# Patient Record
Sex: Female | Born: 1965 | Race: White | Hispanic: No | Marital: Married | Smoking: Never smoker
Health system: Southern US, Community
[De-identification: ages and names within clinical notes are randomized; demographics above are authoritative.]

## PROBLEM LIST (undated history)

## (undated) DIAGNOSIS — N83209 Unspecified ovarian cyst, unspecified side: Secondary | ICD-10-CM

## (undated) DIAGNOSIS — D649 Anemia, unspecified: Secondary | ICD-10-CM

## (undated) DIAGNOSIS — K625 Hemorrhage of anus and rectum: Secondary | ICD-10-CM

## (undated) DIAGNOSIS — J189 Pneumonia, unspecified organism: Secondary | ICD-10-CM

## (undated) DIAGNOSIS — K219 Gastro-esophageal reflux disease without esophagitis: Secondary | ICD-10-CM

## (undated) DIAGNOSIS — K469 Unspecified abdominal hernia without obstruction or gangrene: Secondary | ICD-10-CM

## (undated) DIAGNOSIS — R42 Dizziness and giddiness: Secondary | ICD-10-CM

## (undated) DIAGNOSIS — R7301 Impaired fasting glucose: Secondary | ICD-10-CM

## (undated) DIAGNOSIS — E8881 Metabolic syndrome: Secondary | ICD-10-CM

## (undated) DIAGNOSIS — E079 Disorder of thyroid, unspecified: Secondary | ICD-10-CM

## (undated) HISTORY — PX: ABDOMINAL HYSTERECTOMY: SHX81

## (undated) HISTORY — DX: Gastro-esophageal reflux disease without esophagitis: K21.9

## (undated) HISTORY — PX: TUBAL LIGATION: SHX77

## (undated) HISTORY — DX: Disorder of thyroid, unspecified: E07.9

## (undated) HISTORY — DX: Pneumonia, unspecified organism: J18.9

## (undated) HISTORY — DX: Anemia, unspecified: D64.9

## (undated) HISTORY — DX: Impaired fasting glucose: R73.01

## (undated) HISTORY — DX: Hemorrhage of anus and rectum: K62.5

## (undated) HISTORY — DX: Metabolic syndrome: E88.81

## (undated) HISTORY — DX: Unspecified ovarian cyst, unspecified side: N83.209

## (undated) HISTORY — DX: Dizziness and giddiness: R42

## (undated) HISTORY — DX: Unspecified abdominal hernia without obstruction or gangrene: K46.9

---
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
## 2001-09-18 ENCOUNTER — Encounter: Payer: Self-pay | Admitting: Family Medicine

## 2001-09-18 ENCOUNTER — Ambulatory Visit (HOSPITAL_COMMUNITY): Admission: RE | Admit: 2001-09-18 | Discharge: 2001-09-18 | Payer: Self-pay | Admitting: Family Medicine

## 2004-05-03 DIAGNOSIS — E8881 Metabolic syndrome: Secondary | ICD-10-CM

## 2004-05-03 HISTORY — DX: Metabolic syndrome: E88.81

## 2004-05-03 HISTORY — DX: Metabolic syndrome: E88.810

## 2005-10-17 ENCOUNTER — Emergency Department (HOSPITAL_COMMUNITY): Admission: EM | Admit: 2005-10-17 | Discharge: 2005-10-17 | Payer: Self-pay | Admitting: Emergency Medicine

## 2006-07-22 ENCOUNTER — Emergency Department (HOSPITAL_COMMUNITY): Admission: EM | Admit: 2006-07-22 | Discharge: 2006-07-22 | Payer: Self-pay | Admitting: Emergency Medicine

## 2006-09-21 ENCOUNTER — Ambulatory Visit (HOSPITAL_COMMUNITY): Admission: RE | Admit: 2006-09-21 | Discharge: 2006-09-21 | Payer: Self-pay | Admitting: Family Medicine

## 2007-03-27 ENCOUNTER — Ambulatory Visit (HOSPITAL_COMMUNITY): Admission: RE | Admit: 2007-03-27 | Discharge: 2007-03-27 | Payer: Self-pay | Admitting: Preventative Medicine

## 2007-04-12 ENCOUNTER — Ambulatory Visit (HOSPITAL_COMMUNITY): Admission: RE | Admit: 2007-04-12 | Discharge: 2007-04-12 | Payer: Self-pay | Admitting: Family Medicine

## 2008-05-02 ENCOUNTER — Ambulatory Visit (HOSPITAL_COMMUNITY): Admission: RE | Admit: 2008-05-02 | Discharge: 2008-05-02 | Payer: Self-pay | Admitting: Family Medicine

## 2008-10-07 ENCOUNTER — Encounter: Admission: RE | Admit: 2008-10-07 | Discharge: 2008-10-07 | Payer: Self-pay | Admitting: General Surgery

## 2008-10-08 LAB — HM MAMMOGRAPHY

## 2010-05-24 ENCOUNTER — Encounter: Payer: Self-pay | Admitting: Family Medicine

## 2010-07-07 ENCOUNTER — Emergency Department (HOSPITAL_COMMUNITY)
Admission: EM | Admit: 2010-07-07 | Discharge: 2010-07-07 | Disposition: A | Discharge status: Home / Self Care | Payer: 59 | Attending: Emergency Medicine | Admitting: Emergency Medicine

## 2010-07-07 ENCOUNTER — Emergency Department (HOSPITAL_COMMUNITY): Payer: 59

## 2010-07-07 DIAGNOSIS — K625 Hemorrhage of anus and rectum: Secondary | ICD-10-CM | POA: Insufficient documentation

## 2010-07-07 LAB — DIFFERENTIAL
Basophils Relative: 0 % (ref 0–1)
Eosinophils Absolute: 0.3 10*3/uL (ref 0.0–0.7)
Lymphs Abs: 2.9 10*3/uL (ref 0.7–4.0)
Neutro Abs: 6.8 10*3/uL (ref 1.7–7.7)
Neutrophils Relative %: 62 % (ref 43–77)

## 2010-07-07 LAB — CBC
MCV: 87.8 fL (ref 78.0–100.0)
Platelets: 266 10*3/uL (ref 150–400)
RBC: 4.43 MIL/uL (ref 3.87–5.11)
WBC: 11 10*3/uL — ABNORMAL HIGH (ref 4.0–10.5)

## 2010-07-07 LAB — COMPREHENSIVE METABOLIC PANEL
ALT: 14 U/L (ref 0–35)
AST: 21 U/L (ref 0–37)
Albumin: 4.1 g/dL (ref 3.5–5.2)
Alkaline Phosphatase: 67 U/L (ref 39–117)
Chloride: 102 mEq/L (ref 96–112)
GFR calc Af Amer: >60 mL/min (ref >60)
Potassium: 4 mEq/L (ref 3.5–5.1)
Total Bilirubin: 0.7 mg/dL (ref 0.3–1.2)

## 2010-07-07 LAB — URINALYSIS, ROUTINE W REFLEX MICROSCOPIC
Bilirubin Urine: NEGATIVE
Hgb urine dipstick: NEGATIVE
Protein, ur: NEGATIVE mg/dL
Urobilinogen, UA: 0.2 mg/dL (ref 0.0–1.0)

## 2010-07-07 MED ORDER — IOHEXOL 300 MG/ML  SOLN
100.0000 mL | Freq: Once | INTRAMUSCULAR | Status: AC | PRN
  Administered 2010-07-07: 100 mL via INTRAVENOUS

## 2010-11-20 ENCOUNTER — Encounter (INDEPENDENT_AMBULATORY_CARE_PROVIDER_SITE_OTHER): Payer: Self-pay | Admitting: General Surgery

## 2010-11-20 ENCOUNTER — Ambulatory Visit (INDEPENDENT_AMBULATORY_CARE_PROVIDER_SITE_OTHER): Payer: 59 | Admitting: General Surgery

## 2010-11-20 VITALS — BP 154/102 | HR 76 | Temp 98.2°F | Ht 65.0 in | Wt 282.8 lb

## 2010-11-20 DIAGNOSIS — K429 Umbilical hernia without obstruction or gangrene: Secondary | ICD-10-CM | POA: Insufficient documentation

## 2010-11-20 NOTE — Progress Notes (Signed)
Terri Farley is a 45 y.o. female.    Chief Complaint  Patient presents with  . Other    new pt- hernia    HPI HPI  This patient is here for evaluation of a new umbilical hernia discovered on a recent CT scan of the abdomen which was obtained to evaluate lower abdominal pain. She states that in March she fell on her back and had some bright red blood around the stool and and in the toilet bowl which she describes as "jellylike". She also had some lower abdominal pain and was seen by her physician for evaluation. At that time she had a CT scan of the abdomen which demonstrated a small fat-containing umbilical hernia as well as a small hernia and a left ovarian cyst. She states that she has occasional discomfort at her umbilicus but denies any new changes in her bowels rupture symptoms. She can reduce the bulge with manual reduction which occasionally provides relief of her discomfort. She denies any nausea or vomiting. She has a history of 2 prior colonoscopies in the 1990s for a history of cramping and she states that she was supposed to have a followup scope but hasn't done this yet. Although she states that she has a referral from her primary physician for repeat colonoscopy. She has been concerned about her umbilical hernia since her uncle had an umbilical hernia with what she describes as incarcerated bowel.  Past Medical History  Diagnosis Date  . Thyroid disease     hypothyroid  . Anemia   . Ovarian cyst   . GERD (gastroesophageal reflux disease)   . Hernia   . Hemorrhoids   . Rectal bleeding   . Pneumonia   . Dizzy     Past Surgical History  Procedure Date  . Abdominal hysterectomy   . Foot surgery   . Tubal ligation     Family History  Problem Relation Age of Onset  . Cancer Mother     lymph  . Heart disease Brother     Social History History  Substance Use Topics  . Smoking status: Never Smoker   . Smokeless tobacco: Not on file  . Alcohol Use: No     Allergies  Allergen Reactions  . Vicodin (Hydrocodone-Acetaminophen)     Current Outpatient Prescriptions  Medication Sig Dispense Refill  . MAGNESIUM PO Take 2 tablets by mouth daily.        . Multiple Vitamin (MULTIVITAMIN PO) Take 1 tablet by mouth daily.        Marland Kitchen POTASSIUM CHLORIDE PO Take 99 mg by mouth daily.          Review of Systems Review of Systems  Constitutional: Negative.   HENT: Negative.   Eyes: Negative.   Respiratory: Negative.   Cardiovascular: Positive for leg swelling.  Gastrointestinal: Positive for abdominal pain and blood in stool.  Genitourinary: Negative.   Musculoskeletal: Negative.   Skin: Negative.   Neurological: Negative.   Endo/Heme/Allergies: Negative.   Psychiatric/Behavioral: Negative.   All other systems reviewed and are negative.    Physical Exam Physical Exam  Constitutional: She is oriented to person, place, and time. She appears well-developed and well-nourished. No distress.  HENT:  Head: Normocephalic and atraumatic.  Mouth/Throat: No oropharyngeal exudate.  Eyes: EOM are normal. Pupils are equal, round, and reactive to light. Right eye exhibits no discharge. Left eye exhibits no discharge. No scleral icterus.  Neck: Normal range of motion. Neck supple. No tracheal deviation present.  Cardiovascular: Normal rate, regular rhythm and normal heart sounds.   Respiratory: Effort normal and breath sounds normal. No stridor. No respiratory distress. She has no wheezes. She has no rales.  GI: Soft. Bowel sounds are normal. She exhibits no distension and no mass. There is no tenderness. There is no rebound and no guarding.       Small reducible umbilical hernia, fascial defect of approx 1.5-2cm noted  Musculoskeletal: Normal range of motion. She exhibits edema. She exhibits no tenderness.  Lymphadenopathy:    She has no cervical adenopathy.  Neurological: She is alert and oriented to person, place, and time. She has normal reflexes.   Skin: Skin is warm and dry. No rash noted. She is not diaphoretic. No erythema. No pallor.  Psychiatric: She has a normal mood and affect. Her behavior is normal. Judgment and thought content normal.     Blood pressure 154/102, pulse 76, temperature 98.2 F (36.8 C), height 5\' 5"  (1.651 m), weight 282 lb 12.8 oz (128.277 kg).  Assessment/Plan Umbilical hernia, reducible  She does have a small, reducible, umbilical hernia both on exam and on CT scan. Although this is a small hernia and I doubt it is responsible for any of her abdominal complaints, she is very concerned about the potential for incarceration ensuring relation since it sounds like her uncle had an episode of this. I discussed with her the options for open repair versus laparoscopic repair with or without mesh. She is interested in open repair because she is concerned that the laparoscopic port sites may cause further weakness and potential hernia. I explained that we may have to make a larger incision for open repair given her body habitus that she was okay with this. I also discussed the risks of infection, bleeding, pain, scarring, recurrence, need for mesh with its potential complications of foreign body sensation or mesh infection, and bowel injury. She states understanding and desires to proceed with open umbilical hernia repair.   Lodema Pilot DAVID 11/20/2010, 12:27 PM

## 2010-11-26 ENCOUNTER — Telehealth (INDEPENDENT_AMBULATORY_CARE_PROVIDER_SITE_OTHER): Payer: Self-pay

## 2010-11-26 ENCOUNTER — Other Ambulatory Visit (INDEPENDENT_AMBULATORY_CARE_PROVIDER_SITE_OTHER): Payer: Self-pay | Admitting: General Surgery

## 2010-11-26 ENCOUNTER — Encounter (HOSPITAL_COMMUNITY): Payer: 59

## 2010-11-26 LAB — CBC
HCT: 39.2 % (ref 36.0–46.0)
MCHC: 33.2 g/dL (ref 30.0–36.0)
MCV: 87.1 fL (ref 78.0–100.0)
RDW: 13.8 % (ref 11.5–15.5)
WBC: 8.5 10*3/uL (ref 4.0–10.5)

## 2010-11-26 NOTE — Telephone Encounter (Signed)
Terri Farley is requesting for the pt to get thigh high TED hose b/c the pt is requesting them for her surgery on 12-08-10 umbilical hernia. Pls notify Darlene at [facility]

## 2010-12-01 ENCOUNTER — Telehealth (INDEPENDENT_AMBULATORY_CARE_PROVIDER_SITE_OTHER): Payer: Self-pay | Admitting: General Surgery

## 2010-12-02 HISTORY — PX: HERNIA REPAIR: SHX51

## 2010-12-07 ENCOUNTER — Telehealth (INDEPENDENT_AMBULATORY_CARE_PROVIDER_SITE_OTHER): Payer: Self-pay | Admitting: General Surgery

## 2010-12-07 NOTE — Telephone Encounter (Signed)
Pt called stating she had sinus infection over the weekend, pt states she ran low grade fever also, she's states that it seem's to be improving and was concerned that she may need to reschedule her surgery.  I told pt I would notify you and advise on next step.  Please advise.

## 2010-12-08 ENCOUNTER — Ambulatory Visit (HOSPITAL_COMMUNITY)
Admission: RE | Admit: 2010-12-08 | Discharge: 2010-12-08 | Disposition: A | Discharge status: Home / Self Care | Payer: 59 | Source: Ambulatory Visit | Attending: General Surgery | Admitting: General Surgery

## 2010-12-08 DIAGNOSIS — K429 Umbilical hernia without obstruction or gangrene: Secondary | ICD-10-CM

## 2010-12-12 NOTE — Op Note (Signed)
Terri Farley, Terri Farley               ACCOUNT NO.:  [id number]  MEDICAL RECORD NO.:  [id number]  LOCATION:  [facility]                         FACILITY:  [facility]  PHYSICIAN:  Lodema Pilot, MD       DATE OF BIRTH:  Nov 08, 1965  DATE OF PROCEDURE:  12/08/2010 DATE OF DISCHARGE:  12/08/2010                              OPERATIVE REPORT   PROCEDURE:  Open umbilical hernia repair with mesh.  PREOPERATIVE DIAGNOSIS:  Umbilical hernia.  POSTOPERATIVE DIAGNOSIS:  Umbilical hernia.  SURGEON:  Lodema Pilot, MD  ASSISTANT:  Anselm Pancoast. Weatherly, MD.  FLUIDS:  900 mL crystalloid.  ESTIMATED BLOOD LOSS:  Minimal.  DRAINS:  None.  SPECIMENS:  None.  COMPLICATIONS.:  None apparent.  FINDINGS:  A 2 cm fascial defect with placement of  4.3 cm x 4.3 cm PVP patch in underlay fashion with the fascia approximated over the top.  INDICATION FOR THE PROCEDURE:  Terri Farley is a 45 year old female with a reducible umbilical hernia that was found on CT scan and desires that repaired.  OPERATIVE DETAILS:  Terri Farley was seen and evaluated in the preoperative area and risks and benefits of the procedure were again discussed and later informed consent was obtained.  Given the fact that she had a recent cough, I recommended scheduling her surgery due to the risk of recurrence and she persisted coughing after her procedure, even though she was feeling better, I explained that it would the most secure and safe thing to do was to reschedule, she refused to reschedule and desired to proceed with the hernia repair despite potential for high recurrence rate.  The surgical site was marked and she was given prophylactic antibiotics and taken to the operating room, placed on table in supine position.  General endotracheal tube anesthesia was obtained and her abdomen was prepped and draped in standard surgical fashion.  A semicircular infraumbilical incision was made in the skin and dissection carried down to the  subcutaneous tissue using Bovie electrocautery.  Normal fascia was identified and the umbilicus was bluntly dissected and elevated from the underlying hernia sac.  The hernia sac was opened and the containing preperitoneal fat and the contents were placed back into the abdomen and the fascial defect was approximately 2 cm.  The underlying adhesions to the underside of the fascia were cleared circumferentially to accommodate placement of the PVP Prolene patch and given the fact that she is overweight, her fascia was not very healthy appearing, plus her recent cough I felt it best to place a patch to strengthen the repair.  A 4.2 cm patch was placed in the underlying fascia and 2-0 Prolene sutures were parachuted up through the to the fascia at the 12 o'clock, 3 o'clock, 6 o'clock, and 9 o'clock positions and this was secured and the patch appeared to lie flat and covered the defect with overlaps.  The tails were cut on the mesh and the fascial defect was then closed with 0 Ethibond interrupted sutures. Tails of the mesh were incorporated into the fascial closure and the fascia was approximated over the mesh with the interrupted Ethibond sutures.  The wound was noted to be hemostatic and the fascia  was injected with a 30 mL of 1% lidocaine with epinephrine and 0.25% Marcaine in a 50/50 mixture.  Wounds were well irrigated and again noted to be hemostatic.  The base of the umbilicus was tacked to the underlying fascia with two 3-0 Vicryl sutures.  The dermis was approximated with interrupted 3-0 Vicryl sutures.  The skin was closed with 4-0 Monocryl subcuticular suture.  Skin was washed and dried and Dermabond was applied and sterile vacuum dressing was applied.  All sponge, needle, and instrument counts were correct at the end of the case and the patient tolerated the procedure well without apparent complications.          ______________________________ Lodema Pilot, MD     BL/MEDQ   D:  12/08/2010  T:  12/09/2010  Job:  [id number]  Electronically Signed by Lodema Pilot DO on 12/12/2010 06:02:40 PM

## 2010-12-22 ENCOUNTER — Ambulatory Visit (INDEPENDENT_AMBULATORY_CARE_PROVIDER_SITE_OTHER): Payer: Self-pay | Admitting: General Surgery

## 2011-01-01 ENCOUNTER — Encounter (INDEPENDENT_AMBULATORY_CARE_PROVIDER_SITE_OTHER): Payer: Self-pay

## 2011-01-01 ENCOUNTER — Ambulatory Visit (INDEPENDENT_AMBULATORY_CARE_PROVIDER_SITE_OTHER): Payer: 59 | Admitting: General Surgery

## 2011-01-01 VITALS — BP 130/98 | Temp 97.9°F | Wt 275.8 lb

## 2011-01-01 DIAGNOSIS — Z4889 Encounter for other specified surgical aftercare: Secondary | ICD-10-CM

## 2011-01-01 DIAGNOSIS — Z5189 Encounter for other specified aftercare: Secondary | ICD-10-CM

## 2011-01-01 NOTE — Progress Notes (Signed)
Subjective:     Patient ID: Terri Farley, female   DOB: Oct 04, 1965, 45 y.o.   MRN: [id number]  HPI Doing well 3 weeks s/p umbilical hernia repair with mesh, no pain, tolerating regular diet and bowels functioning.  She had bronchitis/URI perioperative with postop coughing spells.   Review of Systems     Objective:   Physical Exam NAD, Nontoxic Abdomen soft, nt, nd, incision healing well without infection, healing ridge but no sign of infection    Assessment:     S/p umbilical hernia repair with mesh, doing well     Plan:     May return to work without limitations.  F/u prn

## 2012-02-24 IMAGING — CT CT ABD-PELV W/ CM
2 of 5 series · 17 of 46 positions shown, 19 images · IV contrast (Omnipaque 300)
Comparison: None

CLINICAL DATA: Left lower quadrant pain, cramping, blood in stools,
nausea, history GERD, hypothyroidism

CT ABDOMEN AND PELVIS WITH CONTRAST
TECHNIQUE: Multidetector CT imaging of the abdomen and pelvis was
performed following the standard protocol during bolus
administration of intravenous contrast. Breast shield utilized.
Sagittal and coronal MPR images reconstructed from axial data set.
Contrast: 100 ml Omnipaque 300 IV No oral contrast administered.

[Series 2: abd_pel_with 5.0 b40s · axial · 0.98mm/px · z∈[-477,-62]mm · 14 of 95 slices shown, 16 images]
[im 6/95  soft-tissue]
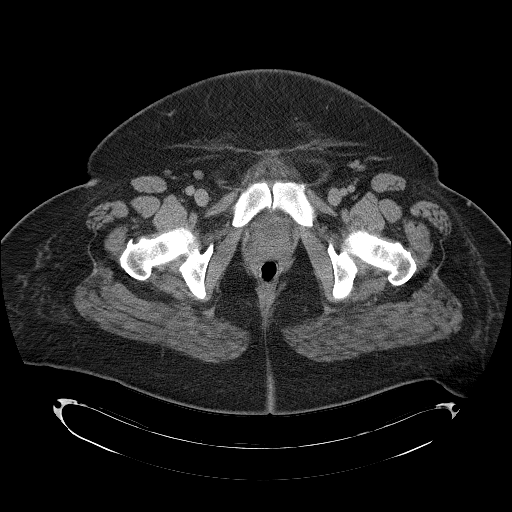
[im 6/95  bone]
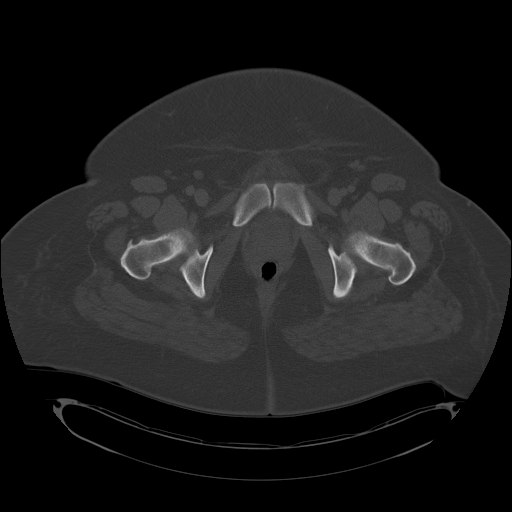
[im 12/95  soft-tissue]
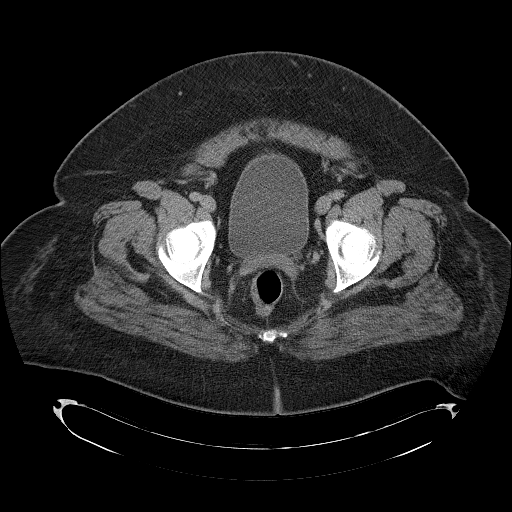
[im 18/95  soft-tissue]
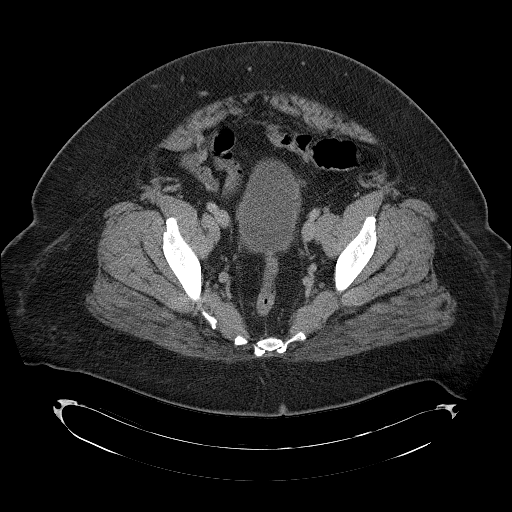
[im 24/95  soft-tissue]
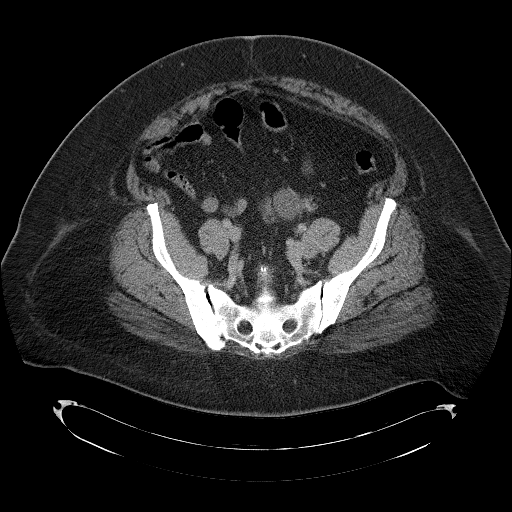
[im 30/95  soft-tissue]
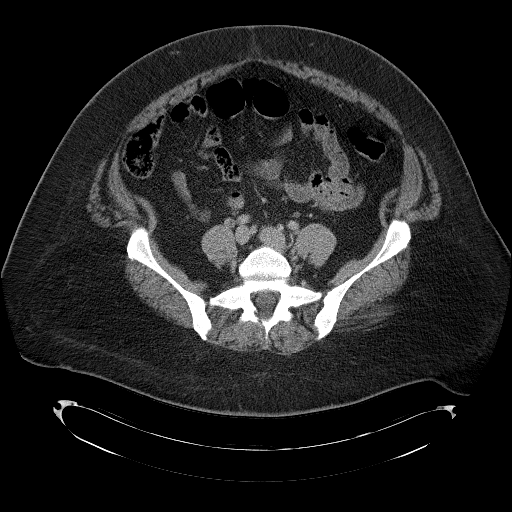
[im 36/95  soft-tissue]
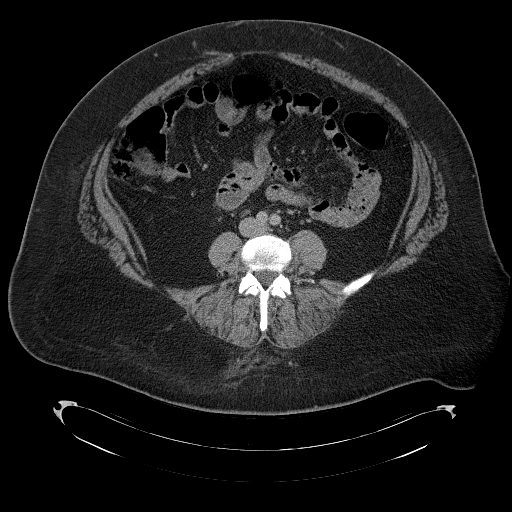
[im 42/95  soft-tissue]
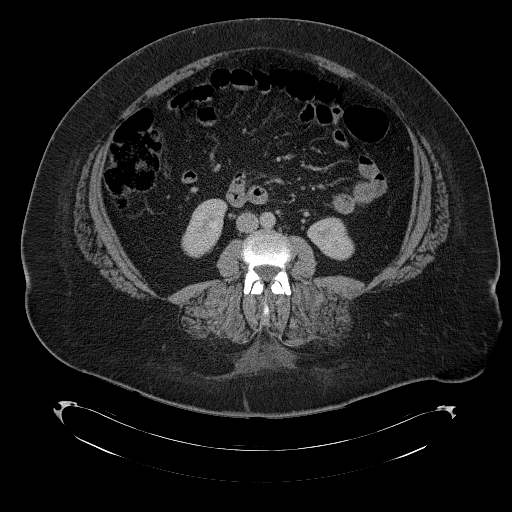
[im 53/95  soft-tissue]
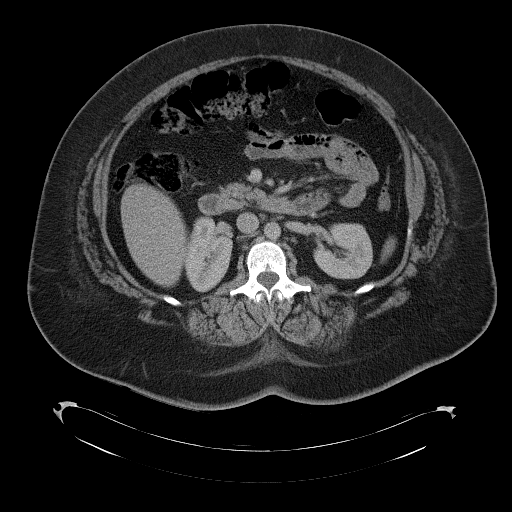
[im 59/95  soft-tissue]
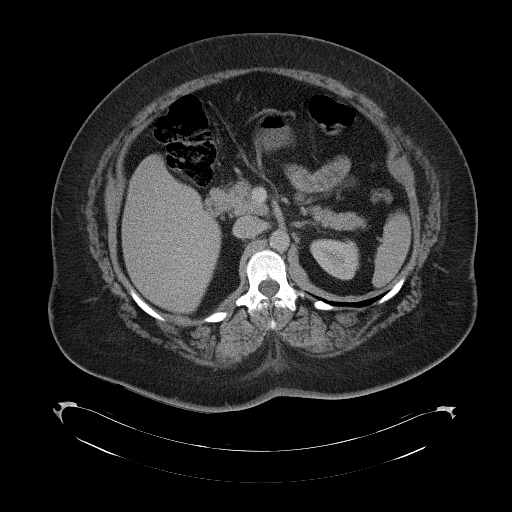
[im 59/95  bone]
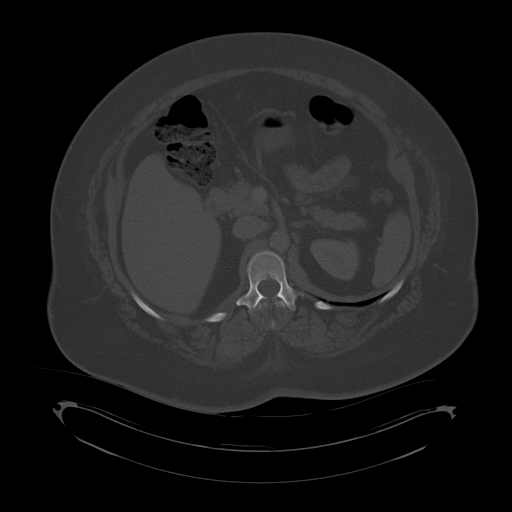
[im 65/95  soft-tissue]
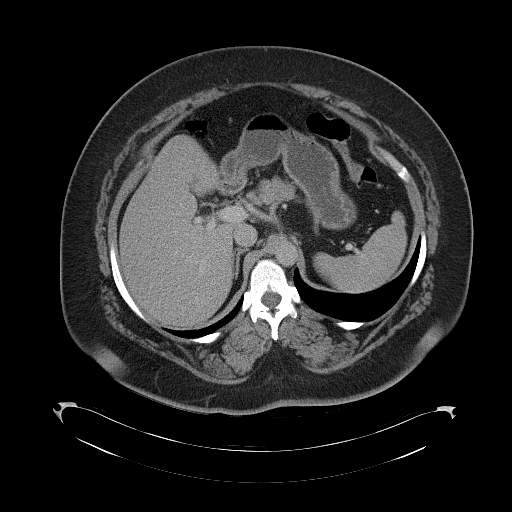
[im 71/95  soft-tissue]
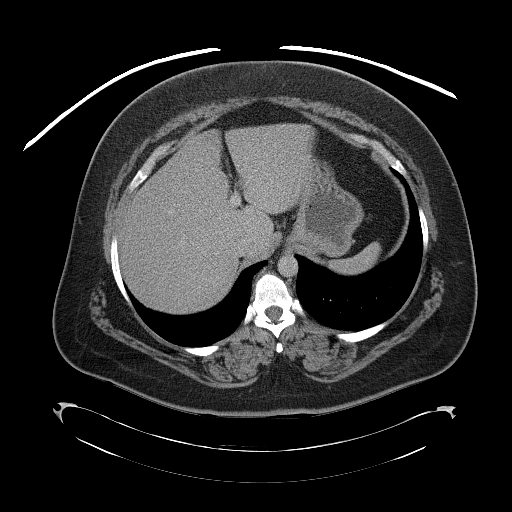
[im 77/95  soft-tissue]
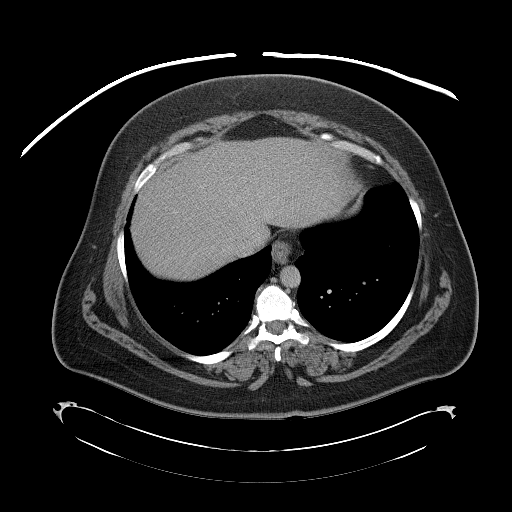
[im 83/95  soft-tissue]
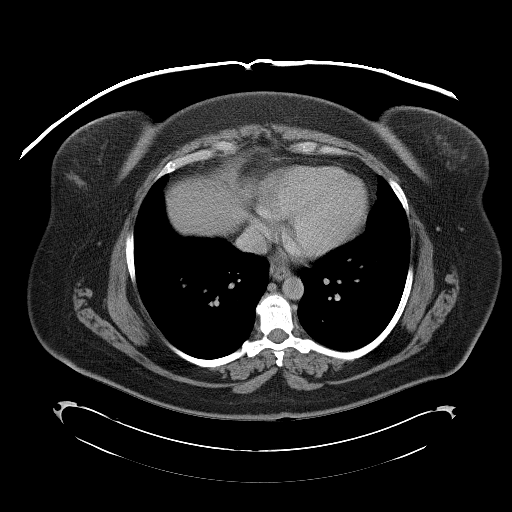
[im 89/95  soft-tissue]
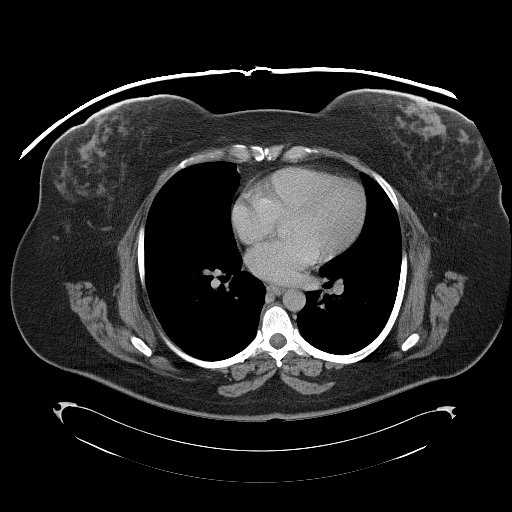

[Series 4: mpr cor post contrast (id) · coronal · 0.94mm/px · 3 of 126 slices shown]
[im 42/126  soft-tissue]
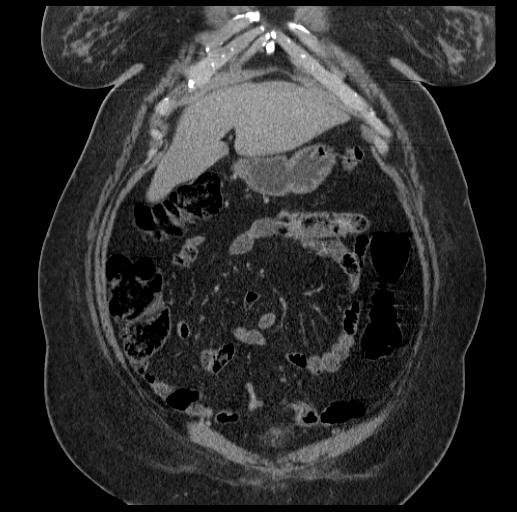
[im 56/126  soft-tissue]
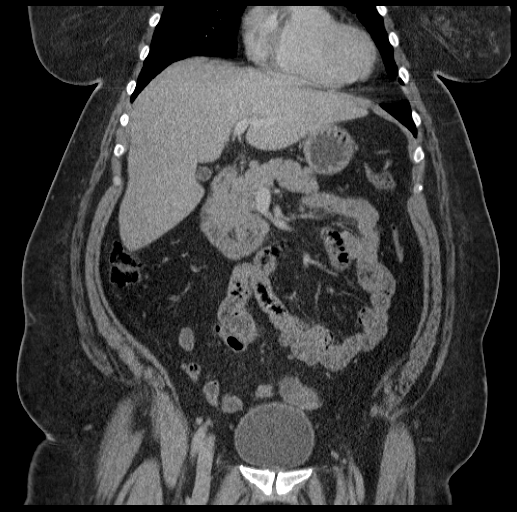
[im 70/126  soft-tissue]
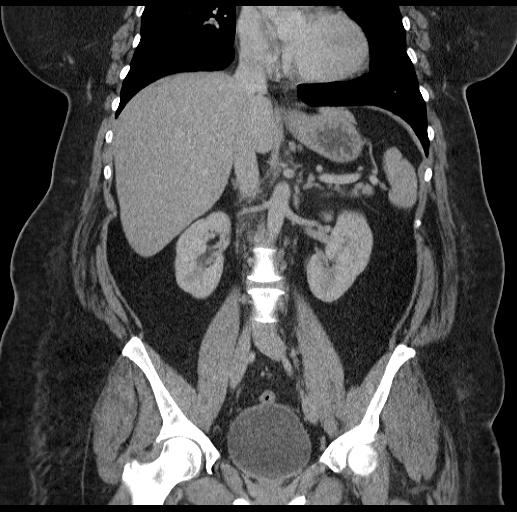

[17 of 46 positions shown; findings below may reference images not displayed]

FINDINGS: Lung bases clear.
Minimal focal fatty infiltration of liver adjacent to falciform
fissure.
Remainder of liver, spleen, pancreas, kidneys, and adrenal glands
normal.
Gallbladder contracted.
Questionable small hiatal hernia.
Normal appendix.
Small umbilical hernia containing fat.
Uterus surgically absent with nonvisualization of the right ovary.
Left ovary contains a 2.7 x 3.2 cm diameter low attenuation mass
question cyst.
Stomach incompletely distended, no gross abnormality identified.
Large and small bowel loops grossly unremarkable.
No mass, adenopathy, free fluid or inflammatory process otherwise
seen.
IMPRESSION: Small umbilical hernia containing fat.
Small left ovarian cyst 2.7 x 3.2 cm.
Suspect small hiatal hernia.
No other intra abdominal or intrapelvic abnormalities identified.

## 2013-01-09 ENCOUNTER — Encounter (HOSPITAL_COMMUNITY): Payer: Self-pay

## 2013-01-09 ENCOUNTER — Emergency Department (HOSPITAL_COMMUNITY): Payer: 59

## 2013-01-09 ENCOUNTER — Emergency Department (HOSPITAL_COMMUNITY)
Admission: EM | Admit: 2013-01-09 | Discharge: 2013-01-09 | Disposition: A | Discharge status: Home / Self Care | Payer: 59 | Attending: Emergency Medicine | Admitting: Emergency Medicine

## 2013-01-09 DIAGNOSIS — R0602 Shortness of breath: Secondary | ICD-10-CM

## 2013-01-09 DIAGNOSIS — Z8701 Personal history of pneumonia (recurrent): Secondary | ICD-10-CM | POA: Insufficient documentation

## 2013-01-09 DIAGNOSIS — Z8679 Personal history of other diseases of the circulatory system: Secondary | ICD-10-CM | POA: Insufficient documentation

## 2013-01-09 DIAGNOSIS — Z862 Personal history of diseases of the blood and blood-forming organs and certain disorders involving the immune mechanism: Secondary | ICD-10-CM | POA: Insufficient documentation

## 2013-01-09 DIAGNOSIS — Z8742 Personal history of other diseases of the female genital tract: Secondary | ICD-10-CM | POA: Insufficient documentation

## 2013-01-09 DIAGNOSIS — Z8719 Personal history of other diseases of the digestive system: Secondary | ICD-10-CM | POA: Insufficient documentation

## 2013-01-09 DIAGNOSIS — R0789 Other chest pain: Secondary | ICD-10-CM | POA: Insufficient documentation

## 2013-01-09 DIAGNOSIS — Z8639 Personal history of other endocrine, nutritional and metabolic disease: Secondary | ICD-10-CM | POA: Insufficient documentation

## 2013-01-09 LAB — BASIC METABOLIC PANEL
BUN: 15 mg/dL (ref 6–23)
Chloride: 101 mEq/L (ref 96–112)
Creatinine, Ser: 0.9 mg/dL (ref 0.50–1.10)
GFR calc Af Amer: 87 mL/min — ABNORMAL LOW (ref >90)

## 2013-01-09 LAB — CBC
HCT: 41.4 % (ref 36.0–46.0)
MCV: 88.1 fL (ref 78.0–100.0)
RDW: 13.5 % (ref 11.5–15.5)
WBC: 9.8 10*3/uL (ref 4.0–10.5)

## 2013-01-09 MED ORDER — ASPIRIN 81 MG PO CHEW
324.0000 mg | CHEWABLE_TABLET | Freq: Once | ORAL | Status: AC
  Administered 2013-01-09: 324 mg via ORAL
  Filled 2013-01-09: qty 4

## 2013-01-09 NOTE — ED Provider Notes (Signed)
Scribed for Terri Roller, MD, the patient was seen in room APA02/APA02. This chart was scribed by Lewanda Rife, ED scribe. Patient's care was started at 2018  CSN: [id number]     Arrival date & time 01/09/13  1957 History   First MD Initiated Contact with Patient 01/09/13 2010     Chief Complaint  Patient presents with  . Chest Pain  . Shortness of Breath   (Consider location/radiation/quality/duration/timing/severity/associated sxs/prior Treatment) The history is provided by the patient, a friend and medical records.   HPI Comments: Terri Farley is a 47 y.o. female who presents to the Emergency Department complaining of persistent moderate chest pain onset all day today. Describes chest pain as heavy. Reports associated shortness of breath, nausea, subjective fever, congestion, URI symptoms, onset 1 week. Denies any aggravating or alleviating factors. Denies taking any medications PTA to relieve symptoms. Reports PMHx of GI bleed, and hypothyroidism. Reports has not followed up about thyroid hormone levels (not taking synthroid). Denies recent travels, and fractures. Denies other pertinent PMHx. No other risk factors for PE or acute coronary disease.    Past Medical History  Diagnosis Date  . Thyroid disease     hypothyroid  . Anemia   . Ovarian cyst   . GERD (gastroesophageal reflux disease)   . Hernia   . Hemorrhoids   . Rectal bleeding   . Pneumonia   . Dizzy    Past Surgical History  Procedure Laterality Date  . Abdominal hysterectomy    . Foot surgery    . Tubal ligation     Family History  Problem Relation Age of Onset  . Cancer Mother     lymph  . Heart disease Brother    History  Substance Use Topics  . Smoking status: Never Smoker   . Smokeless tobacco: Not on file  . Alcohol Use: No   OB History   Grav Para Term Preterm Abortions TAB SAB Ect Mult Living                 Review of Systems  Respiratory: Positive for shortness of breath.    Cardiovascular: Positive for chest pain.  All other systems reviewed and are negative.  A complete 10 system review of systems was obtained and all systems are negative except as noted in the HPI and PMH.     Allergies  Vicodin  Home Medications   Current Outpatient Rx  Name  Route  Sig  Dispense  Refill  . MAGNESIUM PO   Oral   Take 2 tablets by mouth daily.           . Multiple Vitamin (MULTIVITAMIN PO)   Oral   Take 1 tablet by mouth daily.           Marland Kitchen POTASSIUM CHLORIDE PO   Oral   Take 99 mg by mouth daily.            BP 132/64  Pulse 67  Temp(Src) 98.3 F (36.8 C) (Oral)  Resp 15  Ht 5' 5.5" (1.664 m)  Wt 280 lb (127.007 kg)  BMI 45.87 kg/m2  SpO2 97% Physical Exam  Nursing note and vitals reviewed. Constitutional: She is oriented to person, place, and time. She appears well-developed and well-nourished. No distress.  HENT:  Head: Normocephalic and atraumatic.  Mouth/Throat: Oropharynx is clear and moist. No oropharyngeal exudate.  Eyes: Conjunctivae and EOM are normal. Pupils are equal, round, and reactive to light. No scleral icterus.  Neck:  Normal range of motion. Neck supple. No tracheal deviation present.  Cardiovascular: Normal rate, regular rhythm and intact distal pulses.   No murmur heard. Pulmonary/Chest: Effort normal and breath sounds normal. No respiratory distress. She has no wheezes.  Abdominal: Soft. Bowel sounds are normal. There is no tenderness.  Musculoskeletal: Normal range of motion. She exhibits no edema.  Mild right lower leg asymmetry, no redness or edema  Neurological: She is alert and oriented to person, place, and time.  Skin: Skin is warm and dry.  Psychiatric: She has a normal mood and affect. Her behavior is normal.    ED Course  Procedures (including critical care time) Medications  aspirin chewable tablet 324 mg (324 mg Oral Given 01/09/13 2105)    Labs Review Labs Reviewed  BASIC METABOLIC PANEL - Abnormal;  Notable for the following:    Glucose, Bld 126 (*)    GFR calc non Af Amer 75 (*)    GFR calc Af Amer 87 (*)    All other components within normal limits  CBC  TROPONIN I  D-DIMER, QUANTITATIVE  TROPONIN I   Imaging Review Dg Chest 2 View  01/09/2013   CLINICAL DATA:  Chest pain. Shortness of breath.  EXAM: CHEST  2 VIEW  COMPARISON:  10/17/2005  FINDINGS: The heart size and mediastinal contours are within normal limits. Both lungs are clear. The visualized skeletal structures are unremarkable.  IMPRESSION: No active cardiopulmonary disease.   Electronically Signed   By: Myles Rosenthal   On: 01/09/2013 21:38    MDM   1. Chest heaviness   2. Shortness of breath    Pt has had ongoing persistent chest heaviness with SOB all day long - she has no ECG changes and normal troponin and d dimer.  Her VS are normal and she is low risk for both PE and ACS.  Labs are unremarkable including the CBC and BMP.  Will obtain second troponin at 10:30.  ED ECG REPORT  I personally interpreted this EKG   Date: 01/09/2013   Rate: 80  Rhythm: normal sinus rhythm  QRS Axis: normal  Intervals: normal  ST/T Wave abnormalities: normal  Conduction Disutrbances:none  Narrative Interpretation:   Old EKG Reviewed: none available  Second trop normal.  ED ECG REPORT  I personally interpreted this EKG   Date: 01/09/2013   Rate: 70  Rhythm: normal sinus rhythm  QRS Axis: normal  Intervals: normal  ST/T Wave abnormalities: normal  Conduction Disutrbances:none  Narrative Interpretation:   Old EKG Reviewed: unchanged  Normal d-dimer, 2 sets of normal troponins with constant for greater than 12 hours chest heaviness with normal EKG which is nonischemic, low risk for ACS as well.  I personally performed the services described in this documentation, which was scribed in my presence. The recorded information has been reviewed and is accurate.      Terri Roller, MD 01/09/13 [phone]

## 2013-01-09 NOTE — ED Notes (Signed)
Shortness of breath, having some chest pain per pt. Feel like heaviness in chest, having nausea and not feeling well per pt.

## 2013-01-12 ENCOUNTER — Encounter: Payer: Self-pay | Admitting: Family Medicine

## 2013-01-12 ENCOUNTER — Ambulatory Visit (INDEPENDENT_AMBULATORY_CARE_PROVIDER_SITE_OTHER): Payer: 59 | Admitting: Family Medicine

## 2013-01-12 VITALS — BP 128/82 | Ht 65.0 in | Wt 291.4 lb

## 2013-01-12 DIAGNOSIS — R739 Hyperglycemia, unspecified: Secondary | ICD-10-CM

## 2013-01-12 DIAGNOSIS — R7309 Other abnormal glucose: Secondary | ICD-10-CM

## 2013-01-12 DIAGNOSIS — E782 Mixed hyperlipidemia: Secondary | ICD-10-CM

## 2013-01-12 DIAGNOSIS — R5381 Other malaise: Secondary | ICD-10-CM

## 2013-01-12 DIAGNOSIS — J209 Acute bronchitis, unspecified: Secondary | ICD-10-CM

## 2013-01-12 LAB — LIPID PANEL
Cholesterol: 179 mg/dL (ref 0–200)
Triglycerides: 227 mg/dL — ABNORMAL HIGH (ref <150)

## 2013-01-12 LAB — HEMOGLOBIN A1C: Mean Plasma Glucose: 131 mg/dL — ABNORMAL HIGH (ref <117)

## 2013-01-12 MED ORDER — LEVOFLOXACIN 500 MG PO TABS: 500.0000 mg | ORAL_TABLET | Freq: Every day | ORAL | Status: AC

## 2013-01-12 NOTE — Progress Notes (Signed)
  Subjective:    Patient ID: Terri Farley, female    DOB: 10-04-1965, 47 y.o.   MRN: [id number]  Chest Pain  This is a new problem. The current episode started in the past 7 days. The onset quality is gradual. The pain is present in the substernal region. The pain is at a severity of 5/10. The pain is moderate. The quality of the pain is described as heavy. The pain does not radiate. Associated symptoms include a cough, malaise/fatigue and shortness of breath. Pertinent negatives include no back pain, fever, hemoptysis or lower extremity edema. The cough is productive. The cough is worsened by activity.   Patient arrives for a follow from the ER for chest pain and SOB. The patient had a normal EKGs along with 2 normal sets of cardiac enzymes as well as normal D-dimer and chest xray. Patient also needs a refill of her resume inhaler. Now she has cough and congestion up green.  ER notes were reviewed in detail with the patient.   Review of Systems  Constitutional: Positive for malaise/fatigue and fatigue. Negative for fever.  HENT: Positive for congestion, rhinorrhea and postnasal drip.   Respiratory: Positive for cough and shortness of breath. Negative for hemoptysis.   Cardiovascular: Positive for chest pain.  Musculoskeletal: Negative for back pain.       Objective:   Physical Exam  Vitals reviewed. Constitutional: She appears well-developed.  HENT:  Head: Normocephalic.  Right Ear: External ear normal.  Left Ear: External ear normal.  Cardiovascular: Normal rate, regular rhythm and normal heart sounds.   No murmur heard. Pulmonary/Chest: Effort normal and breath sounds normal. No respiratory distress. She has no wheezes.  Musculoskeletal: She exhibits no edema.  Lymphadenopathy:    She has no cervical adenopathy.          Assessment & Plan:  FMLA- intermittent Lung and Back, her husband once one of these filled out the patient will bring this in. Patient hasn't  intermittent flareups of her breathing issues plus also intermittent flareups of back pain and discomfort. Because of this they would like to have FMLA on file in case she needs to be absent from work.  It is possible that this current problem is due to bronchitis we will treat with antibiotic. Patient denies any angina symptoms she states she walks up and down steps without having chest pressure tightness or pain I told her if she has a reoccurrence of chest pain she will need referral to cardiology for stress testing she seems to understand this.  I also advised her to do a mammogram advise her to do some lab work and regular health checkups

## 2013-01-14 ENCOUNTER — Encounter: Payer: Self-pay | Admitting: Family Medicine

## 2013-01-15 DIAGNOSIS — Z0289 Encounter for other administrative examinations: Secondary | ICD-10-CM

## 2013-03-01 ENCOUNTER — Encounter: Payer: Self-pay | Admitting: Nurse Practitioner

## 2013-03-01 ENCOUNTER — Ambulatory Visit (INDEPENDENT_AMBULATORY_CARE_PROVIDER_SITE_OTHER): Payer: 59 | Admitting: Nurse Practitioner

## 2013-03-01 VITALS — BP 138/88 | Temp 98.2°F | Ht 65.5 in | Wt 289.0 lb

## 2013-03-01 DIAGNOSIS — J329 Chronic sinusitis, unspecified: Secondary | ICD-10-CM

## 2013-03-01 DIAGNOSIS — Z0289 Encounter for other administrative examinations: Secondary | ICD-10-CM

## 2013-03-01 MED ORDER — NYSTATIN-TRIAMCINOLONE 100000-0.1 UNIT/GM-% EX CREA: TOPICAL_CREAM | Freq: Two times a day (BID) | CUTANEOUS | Status: DC

## 2013-03-01 MED ORDER — AMOXICILLIN 875 MG PO TABS: 875.0000 mg | ORAL_TABLET | Freq: Two times a day (BID) | ORAL | Status: DC

## 2013-03-05 ENCOUNTER — Encounter: Payer: Self-pay | Admitting: Nurse Practitioner

## 2013-03-05 NOTE — Progress Notes (Signed)
Subjective:  Presents with complaints of sore throat and coughing off-and-on for the past week. 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 No fever. Green nasal drainage. No wheezing. Facial area headache. Slight sore throat.  Objective:   BP 138/88  Temp(Src) 98.2 F (36.8 C) (Oral)  Ht 5' 5.5" (1.664 m)  Wt 289 lb (131.09 kg)  BMI 47.34 kg/m2 NAD. Alert, oriented. TMs clear effusion, no erythema. Pharynx erythematous with PND noted. Neck supple with mild soft nontender adenopathy. Lungs clear. Heart regular rate rhythm.  Assessment:Rhinosinusitis  Plan: Meds ordered this encounter  Medications  . amoxicillin (AMOXIL) 875 MG tablet    Sig: Take 1 tablet (875 mg total) by mouth 2 (two) times daily.    Dispense:  20 tablet    Refill:  0    Order Specific Question:  Supervising Provider    Answer:  Merlyn Albert [[id number]]  . nystatin-triamcinolone (MYCOLOG II) cream    Sig: Apply topically 2 (two) times daily. Prn up to 2 weeks at a time    Dispense:  60 g    Refill:  0    Order Specific Question:  Supervising Provider    Answer:  Merlyn Albert [[id number]]   Patient requesting medication for rash under her breast that she gets at times from sweating. OTC meds as directed for congestion. Call back next week if no improvement, sooner if worse.

## 2013-03-09 ENCOUNTER — Telehealth: Payer: Self-pay | Admitting: Nurse Practitioner

## 2013-03-09 NOTE — Telephone Encounter (Signed)
Pt states she needs to have a letter or call stating that the amoxicillin she was issued for her FMLA was for her Rhinitits, etc from that visit. They won't approve her FMLA for this if we don't clarify. Please do this as soon as possible today. Pt apologizes for all this trouble.   Please call [phone] Rushie Goltz is the RN whom don't  Approve of the FMLA papers Or reasons we indicated on the  Papers.

## 2013-03-15 ENCOUNTER — Encounter: Payer: Self-pay | Admitting: Nurse Practitioner

## 2013-03-17 ENCOUNTER — Encounter: Payer: Self-pay | Admitting: *Deleted

## 2013-03-19 ENCOUNTER — Other Ambulatory Visit: Payer: Self-pay | Admitting: Nurse Practitioner

## 2013-03-19 ENCOUNTER — Ambulatory Visit (INDEPENDENT_AMBULATORY_CARE_PROVIDER_SITE_OTHER): Payer: 59 | Admitting: Nurse Practitioner

## 2013-03-19 ENCOUNTER — Encounter: Payer: Self-pay | Admitting: Nurse Practitioner

## 2013-03-19 ENCOUNTER — Encounter: Payer: Self-pay | Admitting: Family Medicine

## 2013-03-19 VITALS — BP 132/90 | Temp 98.5°F | Ht 65.5 in

## 2013-03-19 DIAGNOSIS — J309 Allergic rhinitis, unspecified: Secondary | ICD-10-CM

## 2013-03-19 DIAGNOSIS — J300 Vasomotor rhinitis: Secondary | ICD-10-CM

## 2013-03-19 MED ORDER — AMOXICILLIN 875 MG PO TABS: 875.0000 mg | ORAL_TABLET | Freq: Two times a day (BID) | ORAL | Status: DC

## 2013-03-21 ENCOUNTER — Encounter: Payer: Self-pay | Admitting: Nurse Practitioner

## 2013-03-21 DIAGNOSIS — J300 Vasomotor rhinitis: Secondary | ICD-10-CM | POA: Insufficient documentation

## 2013-03-21 DIAGNOSIS — J309 Allergic rhinitis, unspecified: Secondary | ICD-10-CM | POA: Insufficient documentation

## 2013-03-21 NOTE — Assessment & Plan Note (Signed)
.   amoxicillin (AMOXIL) 875 MG tablet    Sig: Take 1 tablet (875 mg total) by mouth 2 (two) times daily.    Dispense:  20 tablet    Refill:  0    Order Specific Question:  Supervising Provider    Answer:  LUKING, WILLIAM S [[id number]]   Continue current meds as directed. Recheck if symptoms worsen or persist. 

## 2013-03-21 NOTE — Progress Notes (Signed)
Subjective:  Presents for recheck. Has a history of chronic and vasomotor rhinitis. Usually has episodes of exacerbation 2-3 times per year. Currently on Zyrtec Aleve and Nasacort AQ. Producing clear to light yellow mucus. Also using coolmist humidifier. No headache or sore throat. Ear pain. Occasional nonproductive cough.  Objective:   BP 132/90  Temp(Src) 98.5 F (36.9 C) (Oral)  Ht 5' 5.5" (1.664 m) NAD. Alert, oriented. TMs clear effusion more so on the left side. Nasal mucosa pale and slightly boggy. Pharynx injected with PND noted. Neck supple with mild soft nontender adenopathy. Lungs clear. Heart regular rate rhythm.  Assessment: Allergic rhinitis  Vasomotor rhinitis  Plan:  Meds ordered this encounter  Medications  . DISCONTD: nystatin cream (MYCOSTATIN)    Sig:   . amoxicillin (AMOXIL) 875 MG tablet    Sig: Take 1 tablet (875 mg total) by mouth 2 (two) times daily.    Dispense:  20 tablet    Refill:  0    Order Specific Question:  Supervising Provider    Answer:  Merlyn Albert [[id number]]   Continue current meds as directed. Recheck if symptoms worsen or persist.

## 2013-03-21 NOTE — Assessment & Plan Note (Signed)
.   amoxicillin (AMOXIL) 875 MG tablet    Sig: Take 1 tablet (875 mg total) by mouth 2 (two) times daily.    Dispense:  20 tablet    Refill:  0    Order Specific Question:  Supervising Provider    Answer:  Merlyn Albert [[id number]]   Continue current meds as directed. Recheck if symptoms worsen or persist.

## 2013-04-12 ENCOUNTER — Ambulatory Visit (INDEPENDENT_AMBULATORY_CARE_PROVIDER_SITE_OTHER): Payer: 59 | Admitting: Nurse Practitioner

## 2013-04-12 ENCOUNTER — Encounter: Payer: Self-pay | Admitting: Nurse Practitioner

## 2013-04-12 VITALS — BP 118/82 | Ht 65.5 in | Wt 290.0 lb

## 2013-04-12 DIAGNOSIS — L84 Corns and callosities: Secondary | ICD-10-CM

## 2013-04-12 NOTE — Patient Instructions (Signed)
[facility]  [id number]

## 2013-04-13 ENCOUNTER — Encounter: Payer: Self-pay | Admitting: Nurse Practitioner

## 2013-04-13 NOTE — Progress Notes (Signed)
Subjective:  Presents for complaints of tenderness and mild swelling along the lateral edge of the right foot the base of the small toe. Began after wearing a new pair of safety issues. No history of injury. Worse when she wears certain types of shoes. Not as intense when she wears her athletic shoes.  Objective:   BP 118/82  Ht 5' 5.5" (1.664 m)  Wt 290 lb (131.543 kg)  BMI 47.51 kg/m2 NAD. Alert, oriented. Mild callus formation noted on the lateral edge of the foot near the base of the right small toe. Mild edema. No erythema or warmth. Mild tenderness to palpation. A very large thick callus formation 3-4 cm in width noted on the ball of the left foot.  Assessment:Callus of foot  Plan: Anti-inflammatory medications. Try OTC cushions to avoid pressure on this area. Given phone number for local podiatrist if symptoms persist. Warning signs reviewed. Call back sooner if symptoms worsen.

## 2013-05-10 ENCOUNTER — Encounter: Payer: Self-pay | Admitting: Family Medicine

## 2013-05-10 ENCOUNTER — Ambulatory Visit (INDEPENDENT_AMBULATORY_CARE_PROVIDER_SITE_OTHER): Payer: 59 | Admitting: Nurse Practitioner

## 2013-05-10 ENCOUNTER — Encounter: Payer: Self-pay | Admitting: Nurse Practitioner

## 2013-05-10 ENCOUNTER — Ambulatory Visit (HOSPITAL_COMMUNITY)
Admission: RE | Admit: 2013-05-10 | Discharge: 2013-05-10 | Disposition: A | Discharge status: Home / Self Care | Payer: 59 | Source: Ambulatory Visit | Attending: Nurse Practitioner | Admitting: Nurse Practitioner

## 2013-05-10 VITALS — BP 158/98 | Temp 98.5°F | Ht 65.5 in | Wt 288.0 lb

## 2013-05-10 DIAGNOSIS — R109 Unspecified abdominal pain: Secondary | ICD-10-CM

## 2013-05-10 DIAGNOSIS — K5900 Constipation, unspecified: Secondary | ICD-10-CM

## 2013-05-10 DIAGNOSIS — R1904 Left lower quadrant abdominal swelling, mass and lump: Secondary | ICD-10-CM | POA: Insufficient documentation

## 2013-05-10 DIAGNOSIS — N949 Unspecified condition associated with female genital organs and menstrual cycle: Secondary | ICD-10-CM | POA: Insufficient documentation

## 2013-05-10 LAB — BASIC METABOLIC PANEL
BUN: 13 mg/dL (ref 6–23)
CALCIUM: 9.6 mg/dL (ref 8.4–10.5)
CHLORIDE: 101 meq/L (ref 96–112)
CO2: 27 meq/L (ref 19–32)
CREATININE: 0.88 mg/dL (ref 0.50–1.10)
GLUCOSE: 119 mg/dL — AB (ref 70–99)
Potassium: 4.9 mEq/L (ref 3.5–5.3)
Sodium: 140 mEq/L (ref 135–145)

## 2013-05-10 LAB — CBC WITH DIFFERENTIAL/PLATELET
BASOS ABS: 0 10*3/uL (ref 0.0–0.1)
BASOS PCT: 0 % (ref 0–1)
Eosinophils Absolute: 0.3 10*3/uL (ref 0.0–0.7)
Eosinophils Relative: 4 % (ref 0–5)
HEMATOCRIT: 42.5 % (ref 36.0–46.0)
HEMOGLOBIN: 14.2 g/dL (ref 12.0–15.0)
LYMPHS PCT: 35 % (ref 12–46)
Lymphs Abs: 2.7 10*3/uL (ref 0.7–4.0)
MCH: 30 pg (ref 26.0–34.0)
MCHC: 33.4 g/dL (ref 30.0–36.0)
MCV: 89.7 fL (ref 78.0–100.0)
MONO ABS: 0.7 10*3/uL (ref 0.1–1.0)
MONOS PCT: 9 % (ref 3–12)
NEUTROS ABS: 3.9 10*3/uL (ref 1.7–7.7)
NEUTROS PCT: 52 % (ref 43–77)
Platelets: 292 10*3/uL (ref 150–400)
RBC: 4.74 MIL/uL (ref 3.87–5.11)
RDW: 13.6 % (ref 11.5–15.5)
WBC: 7.5 10*3/uL (ref 4.0–10.5)

## 2013-05-10 LAB — LIPASE: Lipase: 49 U/L (ref 11–59)

## 2013-05-10 LAB — HEPATIC FUNCTION PANEL
ALBUMIN: 3.9 g/dL (ref 3.5–5.2)
ALK PHOS: 72 U/L (ref 39–117)
ALT: 11 U/L (ref 0–35)
AST: 15 U/L (ref 0–37)
Bilirubin, Direct: <0.1 mg/dL (ref 0.0–0.3)
TOTAL PROTEIN: 7 g/dL (ref 6.0–8.3)
Total Bilirubin: 0.3 mg/dL (ref 0.3–1.2)

## 2013-05-10 MED ORDER — IOHEXOL 300 MG/ML  SOLN
100.0000 mL | Freq: Once | INTRAMUSCULAR | Status: AC | PRN
  Administered 2013-05-10: 100 mL via INTRAVENOUS

## 2013-05-10 NOTE — Patient Instructions (Signed)
Fleets enema Magnesium citrate Milk and molasses enema; 2 pints of warm milk mixed with 8 oz of molasses

## 2013-05-11 ENCOUNTER — Other Ambulatory Visit: Payer: Self-pay | Admitting: Nurse Practitioner

## 2013-05-11 ENCOUNTER — Telehealth: Payer: Self-pay | Admitting: Family Medicine

## 2013-05-11 ENCOUNTER — Telehealth: Payer: Self-pay | Admitting: Nurse Practitioner

## 2013-05-11 ENCOUNTER — Other Ambulatory Visit: Payer: Self-pay | Admitting: Family Medicine

## 2013-05-11 DIAGNOSIS — Z0289 Encounter for other administrative examinations: Secondary | ICD-10-CM

## 2013-05-11 MED ORDER — HYOSCYAMINE SULFATE 0.125 MG SL SUBL: 0.1250 mg | SUBLINGUAL_TABLET | SUBLINGUAL | Status: DC | PRN

## 2013-05-11 NOTE — Telephone Encounter (Signed)
Yes. Could be due to bowels moving. Will send in Rx for cramps. Call next week if no better. Will need to see GI specialist at [facility]. Go to ED if fever, worsening abd pain or blood in stools.

## 2013-05-11 NOTE — Telephone Encounter (Signed)
Pt would like Terri JonesCarolyn to know that her bowels are moving some, having bad pain and cramps.  Can this be due to her bowels moving?  Aleve helps some but she wants to know if there's anything else she can take for the pain and cramps. She uses [facility], please call pt when done [phone]

## 2013-05-11 NOTE — Telephone Encounter (Signed)
Patient called to let you know she think she saw [facility] for her blood clot back in 1994.

## 2013-05-11 NOTE — Telephone Encounter (Signed)
Notified patient yes. Could be due to bowels moving. Will send in Rx for cramps. Call next week if no better. Will need to see GI specialist at [facility]. Go to ED if fever, worsening abd pain or blood in stools. Patient verbalized understanding.

## 2013-05-12 ENCOUNTER — Encounter: Payer: Self-pay | Admitting: Nurse Practitioner

## 2013-05-12 NOTE — Progress Notes (Signed)
Subjective:  Presents for complaints of "cramps" in her mid to lower abdomen that began on 1/3. Has kept her up at nighttime. Off-and-on cramping during the day, constant and much worse at nighttime. No fever. Slight nausea but no vomiting. Constipated. Having a small hard stool about once a day. Some reflux only with certain foods. Nothing relieves the pain at nighttime. 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 Taking fluids well. No urinary symptoms. Married, same sexual partner. No vaginal discharge. Has had a hysterectomy.  Objective:   BP 158/98  Temp(Src) 98.5 F (36.9 C)  Ht 5' 5.5" (1.664 m)  Wt 288 lb (130.636 kg)  BMI 47.18 kg/m2 NAD. Alert, oriented. Mildly anxious affect. Lungs clear. Heart regular rate rhythm. Abdomen obese soft nondistended with hypoactive bowel sounds x4. Minimal upper abdominal tenderness. Distinct tenderness just below the umbilicus, generalized lower abdominal tenderness. No obvious masses.  Assessment: Abdominal pain, unspecified site - Plan: CT Abdomen Pelvis W Contrast, Lipase, Basic metabolic panel, Hepatic function panel, CBC with Differential, CANCELED: Basic metabolic panel, CANCELED: CBC with Differential, CANCELED: Hepatic function panel, CANCELED: Lipase  Unspecified constipation  Plan:Abdominal pain, unspecified site - Plan: CT Abdomen Pelvis W Contrast, Lipase, Basic metabolic panel, Hepatic function panel, CBC with Differential, CANCELED: Basic metabolic panel, CANCELED: CBC with Differential, CANCELED: Hepatic function panel, CANCELED: Lipase  Unspecified constipation  labs were reordered as stat labs. CT scan of the abdomen with contrast. Discussed measures to help the cramping and constipation. Fleets enema Magnesium citrate Milk and molasses enema; 2 pints of warm milk mixed with 8 oz of molasses Warning signs reviewed. Go to ED if symptoms worsen. Further followup based on test  results.

## 2013-05-14 ENCOUNTER — Telehealth: Payer: Self-pay | Admitting: Family Medicine

## 2013-05-14 NOTE — Telephone Encounter (Signed)
Patient left message wanting you to fill out her FMLA papers .She stated you can go into more detail about what went on with her.   She was seen 1/8 at 8:40.Just put in my basket when finished. thanks

## 2013-07-18 ENCOUNTER — Encounter: Payer: Self-pay | Admitting: Family Medicine

## 2013-07-18 ENCOUNTER — Ambulatory Visit (INDEPENDENT_AMBULATORY_CARE_PROVIDER_SITE_OTHER): Payer: 59 | Admitting: Family Medicine

## 2013-07-18 VITALS — BP 120/80 | Temp 98.3°F | Ht 65.5 in | Wt 284.5 lb

## 2013-07-18 DIAGNOSIS — J45909 Unspecified asthma, uncomplicated: Secondary | ICD-10-CM

## 2013-07-18 DIAGNOSIS — J209 Acute bronchitis, unspecified: Secondary | ICD-10-CM

## 2013-07-18 DIAGNOSIS — Z0289 Encounter for other administrative examinations: Secondary | ICD-10-CM

## 2013-07-18 MED ORDER — AMOXICILLIN 500 MG PO TABS: 500.0000 mg | ORAL_TABLET | Freq: Three times a day (TID) | ORAL | Status: DC

## 2013-07-18 MED ORDER — ALBUTEROL SULFATE HFA 108 (90 BASE) MCG/ACT IN AERS: 2.0000 | INHALATION_SPRAY | Freq: Four times a day (QID) | RESPIRATORY_TRACT | Status: DC | PRN

## 2013-07-18 NOTE — Progress Notes (Signed)
   Subjective:    Patient ID: Terri Farley, female    DOB: 09/02/1965, 48 y.o.   MRN: [id number]  URI  This is a new problem. The current episode started in the past 7 days. The problem has been unchanged. There has been no fever. Associated symptoms include congestion, coughing, rhinorrhea, sinus pain and wheezing. Pertinent negatives include no chest pain or ear pain. She has tried decongestant for the symptoms. The treatment provided no relief.    Patient states she has no other concerns at this time.  Feels like sonme in the cheat Ear pain with swallowing Some wheeze when lays down, some SOB   Review of Systems  Constitutional: Negative for fever and activity change.  HENT: Positive for congestion and rhinorrhea. Negative for ear pain.   Eyes: Negative for discharge.  Respiratory: Positive for cough, shortness of breath and wheezing.   Cardiovascular: Negative for chest pain.       Objective:   Physical Exam  Nursing note and vitals reviewed. Constitutional: She appears well-developed.  HENT:  Head: Normocephalic.  Nose: Nose normal.  Mouth/Throat: Oropharynx is clear and moist. No oropharyngeal exudate.  Neck: Neck supple.  Cardiovascular: Normal rate and normal heart sounds.   No murmur heard. Pulmonary/Chest: Effort normal. She has wheezes.  Lymphadenopathy:    She has no cervical adenopathy.  Skin: Skin is warm and dry.          Assessment & Plan:  Bronchitis/sinusitis- atx and albuterol, warning signs were discussed about what to watch for. I believe this patient should gradually get better but over take her a few days before sure have her energy back she should rest of the next few days while taking the medication she will end up missing work Wednesday Thursday and Friday with a return to work on the following Monday she will call us sooner if any particular problems or if need to be out of work longer

## 2013-10-09 ENCOUNTER — Ambulatory Visit (INDEPENDENT_AMBULATORY_CARE_PROVIDER_SITE_OTHER): Payer: 59 | Admitting: Family Medicine

## 2013-10-09 ENCOUNTER — Encounter: Payer: Self-pay | Admitting: Family Medicine

## 2013-10-09 VITALS — BP 138/82 | Temp 98.3°F | Ht 65.5 in | Wt 279.0 lb

## 2013-10-09 DIAGNOSIS — J209 Acute bronchitis, unspecified: Secondary | ICD-10-CM

## 2013-10-09 DIAGNOSIS — J019 Acute sinusitis, unspecified: Secondary | ICD-10-CM

## 2013-10-09 MED ORDER — AMOXICILLIN 500 MG PO TABS: 500.0000 mg | ORAL_TABLET | Freq: Three times a day (TID) | ORAL | Status: DC

## 2013-10-09 NOTE — Progress Notes (Signed)
   Subjective:    Patient ID: Terri Farley, female    DOB: July 18, 1965, 48 y.o.   MRN: [id number]  Cough This is a new problem. The current episode started 1 to 4 weeks ago. Associated symptoms include myalgias, nasal congestion and wheezing. Pertinent negatives include no chest pain, ear pain, fever, rhinorrhea or shortness of breath. Treatments tried: nyquil, zyrtec. The treatment provided no relief.    She gets this periodically. Not around any smoke currently  Review of Systems  Constitutional: Negative for fever and activity change.  HENT: Positive for congestion. Negative for ear pain and rhinorrhea.   Eyes: Negative for discharge.  Respiratory: Positive for cough and wheezing. Negative for shortness of breath.   Cardiovascular: Negative for chest pain.  Musculoskeletal: Positive for myalgias.       Objective:   Physical Exam  Nursing note and vitals reviewed. Constitutional: She appears well-developed.  HENT:  Head: Normocephalic.  Nose: Nose normal.  Mouth/Throat: Oropharynx is clear and moist. No oropharyngeal exudate.  Neck: Neck supple.  Cardiovascular: Normal rate and normal heart sounds.   No murmur heard. Pulmonary/Chest: Effort normal and breath sounds normal. She has no wheezes.  Lymphadenopathy:    She has no cervical adenopathy.  Skin: Skin is warm and dry.          Assessment & Plan:  Viral illness that led to a sinus infection antibiotics prescribed warning signs discussed followup if problems she does relate mild headache with this mainly in the sinus regions no vomiting.

## 2013-10-19 ENCOUNTER — Telehealth: Payer: Self-pay | Admitting: Family Medicine

## 2013-10-19 MED ORDER — CEFPROZIL 500 MG PO TABS: 500.0000 mg | ORAL_TABLET | Freq: Two times a day (BID) | ORAL | Status: DC

## 2013-10-19 NOTE — Telephone Encounter (Signed)
May call lens Cefzil 500 mg 1 twice a day for 10 days followup if ongoing troubles

## 2013-10-19 NOTE — Telephone Encounter (Signed)
Patient is still having sinus drainage, congestion with yellow mucous. She said she is on her last amoxicillan, but would like to try something else. She states her symptoms have definitely improved.   [facility]

## 2013-10-19 NOTE — Telephone Encounter (Signed)
meds sent to pharm. Left message notifying pt.

## 2014-01-02 ENCOUNTER — Encounter: Payer: Self-pay | Admitting: Family Medicine

## 2014-01-02 ENCOUNTER — Ambulatory Visit (INDEPENDENT_AMBULATORY_CARE_PROVIDER_SITE_OTHER): Payer: 59 | Admitting: Family Medicine

## 2014-01-02 VITALS — BP 112/80 | Temp 98.5°F | Ht 65.5 in | Wt 279.0 lb

## 2014-01-02 DIAGNOSIS — J019 Acute sinusitis, unspecified: Secondary | ICD-10-CM

## 2014-01-02 DIAGNOSIS — J329 Chronic sinusitis, unspecified: Secondary | ICD-10-CM

## 2014-01-02 DIAGNOSIS — J301 Allergic rhinitis due to pollen: Secondary | ICD-10-CM

## 2014-01-02 MED ORDER — VALACYCLOVIR HCL 1 G PO TABS: ORAL_TABLET | ORAL | Status: DC

## 2014-01-02 MED ORDER — AMOXICILLIN 500 MG PO TABS: 500.0000 mg | ORAL_TABLET | Freq: Three times a day (TID) | ORAL | Status: DC

## 2014-01-02 NOTE — Progress Notes (Signed)
   Subjective:    Patient ID: Terri Farley, female    DOB: 12-02-1965, 48 y.o.   MRN: [id number]  Sore Throat  This is a new problem. The current episode started today. Associated symptoms include coughing, headaches and a hoarse voice. Treatments tried: zyrtec.   She has a history of allergies having head congestion drainage coughing sinus pressure mucoid drainage   Review of Systems  HENT: Positive for hoarse voice.   Respiratory: Positive for cough.   Neurological: Positive for headaches.       Objective:   Physical Exam None sinus tenderness to percussion although with eardrums normal throat normal neck supple lungs clear       Assessment & Plan:  Sinusitis moment allergies allergy medicine as well as antibiotics warning signs discussed followup ongoing troubles

## 2014-01-03 ENCOUNTER — Telehealth: Payer: Self-pay | Admitting: Family Medicine

## 2014-01-03 NOTE — Telephone Encounter (Signed)
Nurses send this to dr scott's box

## 2014-01-03 NOTE — Telephone Encounter (Signed)
Patient is currently on intermittent FMLA. She said that at her visit on 01/02/2014, she was diagnosed with sinusitis. The nurse at her work questioned her because it was not bronchitis. She called and asked if we could possibly add to the notes on 01/02/2014 that she could have had bronchitis. If not, she just wanted Dr. Lorin Picket to be aware that they may give her a hard time in the future because of this.

## 2014-01-03 NOTE — Telephone Encounter (Signed)
Wow this is nit picking by the workplace, she had components of bronchitis as well. She may have letter stating this was part of her illness. I rec pt RE DO her FMLA for it to state pt has intermittent issues of Upper resp illness, sinusitis and bronchitis. This would protect her in the future. ( certainly there is a limit in how many days a patient can claim FMLA per month

## 2014-01-04 NOTE — Telephone Encounter (Signed)
Tried calling patient, but her phone was not accepting calls at the time.

## 2014-01-09 ENCOUNTER — Telehealth: Payer: Self-pay | Admitting: Family Medicine

## 2014-01-09 DIAGNOSIS — Z0289 Encounter for other administrative examinations: Secondary | ICD-10-CM

## 2014-01-10 NOTE — Telephone Encounter (Signed)
error 

## 2014-02-21 ENCOUNTER — Ambulatory Visit (INDEPENDENT_AMBULATORY_CARE_PROVIDER_SITE_OTHER): Payer: 59 | Admitting: Family Medicine

## 2014-02-21 ENCOUNTER — Encounter: Payer: Self-pay | Admitting: Family Medicine

## 2014-02-21 VITALS — BP 138/94 | Temp 98.4°F | Ht 65.5 in | Wt 277.0 lb

## 2014-02-21 DIAGNOSIS — J329 Chronic sinusitis, unspecified: Secondary | ICD-10-CM

## 2014-02-21 DIAGNOSIS — J310 Chronic rhinitis: Secondary | ICD-10-CM

## 2014-02-21 DIAGNOSIS — J209 Acute bronchitis, unspecified: Secondary | ICD-10-CM

## 2014-02-21 MED ORDER — LEVOFLOXACIN 500 MG PO TABS: 500.0000 mg | ORAL_TABLET | Freq: Every day | ORAL | Status: DC

## 2014-02-21 NOTE — Progress Notes (Signed)
   Subjective:    Patient ID: Terri Farley, female    DOB: 04/20/1966, 48 y.o.   MRN: [id number]  Cough This is a new problem. Episode onset: Monday. The problem has been gradually improving. The cough is productive of purulent sputum. Associated symptoms include headaches, nasal congestion, rhinorrhea and wheezing. Nothing aggravates the symptoms. She has tried OTC cough suppressant for the symptoms. The treatment provided mild relief.    PMH  Review of Systems  HENT: Positive for rhinorrhea.   Respiratory: Positive for cough and wheezing.   Neurological: Positive for headaches.       Objective:   Physical Exam Lungs are clear hearts regular pulse normal extremities no edema skin warm dry neurologic gross normal  Moderate sinus tenderness     Assessment & Plan:  Viral syndrome with secondary sinusitis and bronchitis antibiotics prescribed warning signs discussed followup if progressive troubles

## 2014-02-21 NOTE — Patient Instructions (Signed)
DASH Eating Plan °DASH stands for "Dietary Approaches to Stop Hypertension." The DASH eating plan is a healthy eating plan that has been shown to reduce high blood pressure (hypertension). Additional health benefits may include reducing the risk of type 2 diabetes mellitus, heart disease, and stroke. The DASH eating plan may also help with weight loss. °WHAT DO I NEED TO KNOW ABOUT THE DASH EATING PLAN? °For the DASH eating plan, you will follow these general guidelines: °· Choose foods with a percent daily value for sodium of less than 5% (as listed on the food label). °· Use salt-free seasonings or herbs instead of table salt or sea salt. °· Check with your health care provider or pharmacist before using salt substitutes. °· Eat lower-sodium products, often labeled as "lower sodium" or "no salt added." °· Eat fresh foods. °· Eat more vegetables, fruits, and low-fat dairy products. °· Choose whole grains. Look for the word "whole" as the first word in the ingredient list. °· Choose fish and skinless chicken or turkey more often than red meat. Limit fish, poultry, and meat to 6 oz (170 g) each day. °· Limit sweets, desserts, sugars, and sugary drinks. °· Choose heart-healthy fats. °· Limit cheese to 1 oz (28 g) per day. °· Eat more home-cooked food and less restaurant, buffet, and fast food. °· Limit fried foods. °· Cook foods using methods other than frying. °· Limit canned vegetables. If you do use them, rinse them well to decrease the sodium. °· When eating at a restaurant, ask that your food be prepared with less salt, or no salt if possible. °WHAT FOODS CAN I EAT? °Seek help from a dietitian for individual calorie needs. °Grains °Whole grain or whole wheat bread. Brown rice. Whole grain or whole wheat pasta. Quinoa, bulgur, and whole grain cereals. Low-sodium cereals. Corn or whole wheat flour tortillas. Whole grain cornbread. Whole grain crackers. Low-sodium crackers. °Vegetables °Fresh or frozen vegetables  (raw, steamed, roasted, or grilled). Low-sodium or reduced-sodium tomato and vegetable juices. Low-sodium or reduced-sodium tomato sauce and paste. Low-sodium or reduced-sodium canned vegetables.  °Fruits °All fresh, canned (in natural juice), or frozen fruits. °Meat and Other Protein Products °Ground beef (85% or leaner), grass-fed beef, or beef trimmed of fat. Skinless chicken or turkey. Ground chicken or turkey. Pork trimmed of fat. All fish and seafood. Eggs. Dried beans, peas, or lentils. Unsalted nuts and seeds. Unsalted canned beans. °Dairy °Low-fat dairy products, such as skim or 1% milk, 2% or reduced-fat cheeses, low-fat ricotta or cottage cheese, or plain low-fat yogurt. Low-sodium or reduced-sodium cheeses. °Fats and Oils °Tub margarines without trans fats. Light or reduced-fat mayonnaise and salad dressings (reduced sodium). Avocado. Safflower, olive, or canola oils. Natural peanut or almond butter. °Other °Unsalted popcorn and pretzels. °The items listed above may not be a complete list of recommended foods or beverages. Contact your dietitian for more options. °WHAT FOODS ARE NOT RECOMMENDED? °Grains °White bread. White pasta. White rice. Refined cornbread. Bagels and croissants. Crackers that contain trans fat. °Vegetables °Creamed or fried vegetables. Vegetables in a cheese sauce. Regular canned vegetables. Regular canned tomato sauce and paste. Regular tomato and vegetable juices. °Fruits °Dried fruits. Canned fruit in light or heavy syrup. Fruit juice. °Meat and Other Protein Products °Fatty cuts of meat. Ribs, chicken wings, bacon, sausage, bologna, salami, chitterlings, fatback, hot dogs, bratwurst, and packaged luncheon meats. Salted nuts and seeds. Canned beans with salt. °Dairy °Whole or 2% milk, cream, half-and-half, and cream cheese. Whole-fat or sweetened yogurt. Full-fat   cheeses or blue cheese. Nondairy creamers and whipped toppings. Processed cheese, cheese spreads, or cheese  curds. °Condiments °Onion and garlic salt, seasoned salt, table salt, and sea salt. Canned and packaged gravies. Worcestershire sauce. Tartar sauce. Barbecue sauce. Teriyaki sauce. Soy sauce, including reduced sodium. Steak sauce. Fish sauce. Oyster sauce. Cocktail sauce. Horseradish. Ketchup and mustard. Meat flavorings and tenderizers. Bouillon cubes. Hot sauce. Tabasco sauce. Marinades. Taco seasonings. Relishes. °Fats and Oils °Butter, stick margarine, lard, shortening, ghee, and bacon fat. Coconut, palm kernel, or palm oils. Regular salad dressings. °Other °Pickles and olives. Salted popcorn and pretzels. °The items listed above may not be a complete list of foods and beverages to avoid. Contact your dietitian for more information. °WHERE CAN I FIND MORE INFORMATION? °National Heart, [employer], and [employer] Institute: www.nhlbi.nih.gov/health/health-topics/topics/dash/ °Document Released: 04/08/2011 Document Revised: 09/03/2013 Document Reviewed: 02/21/2013 °Exit[employer]® Patient Information ©2015 [employer]. This information is not intended to replace advice given to you by your health care provider. Make sure you discuss any questions you have with your health care provider. ° °

## 2014-09-24 ENCOUNTER — Ambulatory Visit (INDEPENDENT_AMBULATORY_CARE_PROVIDER_SITE_OTHER): Payer: 59 | Admitting: Family Medicine

## 2014-09-24 ENCOUNTER — Encounter: Payer: Self-pay | Admitting: Family Medicine

## 2014-09-24 VITALS — BP 130/82 | Temp 98.1°F | Ht 65.5 in | Wt 287.0 lb

## 2014-09-24 DIAGNOSIS — R062 Wheezing: Secondary | ICD-10-CM

## 2014-09-24 DIAGNOSIS — B349 Viral infection, unspecified: Secondary | ICD-10-CM

## 2014-09-24 DIAGNOSIS — J209 Acute bronchitis, unspecified: Secondary | ICD-10-CM | POA: Diagnosis not present

## 2014-09-24 MED ORDER — AZITHROMYCIN 250 MG PO TABS: ORAL_TABLET | ORAL | Status: DC

## 2014-09-24 MED ORDER — ALBUTEROL SULFATE HFA 108 (90 BASE) MCG/ACT IN AERS: 2.0000 | INHALATION_SPRAY | Freq: Four times a day (QID) | RESPIRATORY_TRACT | Status: DC | PRN

## 2014-09-24 NOTE — Patient Instructions (Signed)
How to Use an Inhaler Proper inhaler technique is very important. Good technique ensures that the medicine reaches the lungs. Poor technique results in depositing the medicine on the tongue and back of the throat rather than in the airways. If you do not use the inhaler with good technique, the medicine will not help you. STEPS TO FOLLOW IF USING AN INHALER WITHOUT AN EXTENSION TUBE 1. Remove the cap from the inhaler. 2. If you are using the inhaler for the first time, you will need to prime it. Shake the inhaler for 5 seconds and release four puffs into the air, away from your face. Ask your health care provider or pharmacist if you have questions about priming your inhaler. 3. Shake the inhaler for 5 seconds before each breath in (inhalation). 4. Position the inhaler so that the top of the canister faces up. 5. Put your index finger on the top of the medicine canister. Your thumb supports the bottom of the inhaler. 6. Open your mouth. 7. Either place the inhaler between your teeth and place your lips tightly around the mouthpiece, or hold the inhaler 1-2 inches away from your open mouth. If you are unsure of which technique to use, ask your health care provider. 8. Breathe out (exhale) normally and as completely as possible. 9. Press the canister down with your index finger to release the medicine. 10. At the same time as the canister is pressed, inhale deeply and slowly until your lungs are completely filled. This should take 4-6 seconds. Keep your tongue down. 11. Hold the medicine in your lungs for 5-10 seconds (10 seconds is best). This helps the medicine get into the small airways of your lungs. 12. Breathe out slowly, through pursed lips. Whistling is an example of pursed lips. 13. Wait at least 15-30 seconds between puffs. Continue with the above steps until you have taken the number of puffs your health care provider has ordered. Do not use the inhaler more than your health care provider  tells you. 14. Replace the cap on the inhaler. 15. Follow the directions from your health care provider or the inhaler insert for cleaning the inhaler. STEPS TO FOLLOW IF USING AN INHALER WITH AN EXTENSION (SPACER) 1. Remove the cap from the inhaler. 2. If you are using the inhaler for the first time, you will need to prime it. Shake the inhaler for 5 seconds and release four puffs into the air, away from your face. Ask your health care provider or pharmacist if you have questions about priming your inhaler. 3. Shake the inhaler for 5 seconds before each breath in (inhalation). 4. Place the open end of the spacer onto the mouthpiece of the inhaler. 5. Position the inhaler so that the top of the canister faces up and the spacer mouthpiece faces you. 6. Put your index finger on the top of the medicine canister. Your thumb supports the bottom of the inhaler and the spacer. 7. Breathe out (exhale) normally and as completely as possible. 8. Immediately after exhaling, place the spacer between your teeth and into your mouth. Close your lips tightly around the spacer. 9. Press the canister down with your index finger to release the medicine. 10. At the same time as the canister is pressed, inhale deeply and slowly until your lungs are completely filled. This should take 4-6 seconds. Keep your tongue down and out of the way. 11. Hold the medicine in your lungs for 5-10 seconds (10 seconds is best). This helps the   medicine get into the small airways of your lungs. Exhale. 12. Repeat inhaling deeply through the spacer mouthpiece. Again hold that breath for up to 10 seconds (10 seconds is best). Exhale slowly. If it is difficult to take this second deep breath through the spacer, breathe normally several times through the spacer. Remove the spacer from your mouth. 13. Wait at least 15-30 seconds between puffs. Continue with the above steps until you have taken the number of puffs your health care provider has  ordered. Do not use the inhaler more than your health care provider tells you. 14. Remove the spacer from the inhaler, and place the cap on the inhaler. 15. Follow the directions from your health care provider or the inhaler insert for cleaning the inhaler and spacer. If you are using different kinds of inhalers, use your quick relief medicine to open the airways 10-15 minutes before using a steroid if instructed to do so by your health care provider. If you are unsure which inhalers to use and the order of using them, ask your health care provider, nurse, or respiratory therapist. If you are using a steroid inhaler, always rinse your mouth with water after your last puff, then gargle and spit out the water. Do not swallow the water. AVOID:  Inhaling before or after starting the spray of medicine. It takes practice to coordinate your breathing with triggering the spray.  Inhaling through the nose (rather than the mouth) when triggering the spray. HOW TO DETERMINE IF YOUR INHALER IS FULL OR NEARLY EMPTY You cannot know when an inhaler is empty by shaking it. A few inhalers are now being made with dose counters. Ask your health care provider for a prescription that has a dose counter if you feel you need that extra help. If your inhaler does not have a counter, ask your health care provider to help you determine the date you need to refill your inhaler. Write the refill date on a calendar or your inhaler canister. Refill your inhaler 7-10 days before it runs out. Be sure to keep an adequate supply of medicine. This includes making sure it is not expired, and that you have a spare inhaler.  SEEK MEDICAL CARE IF:   Your symptoms are only partially relieved with your inhaler.  You are having trouble using your inhaler.  You have some increase in phlegm. SEEK IMMEDIATE MEDICAL CARE IF:   You feel little or no relief with your inhalers. You are still wheezing and are feeling shortness of breath or  tightness in your chest or both.  You have dizziness, headaches, or a fast heart rate.  You have chills, fever, or night sweats.  You have a noticeable increase in phlegm production, or there is blood in the phlegm. MAKE SURE YOU:   Understand these instructions.  Will watch your condition.  Will get help right away if you are not doing well or get worse. Document Released: 04/16/2000 Document Revised: 02/07/2013 Document Reviewed: 11/16/2012 [employer] Patient Information 2015 [employer]. This information is not intended to replace advice given to you by your health care provider. Make sure you discuss any questions you have with your health care provider.  

## 2014-09-24 NOTE — Progress Notes (Signed)
   Subjective:    Patient ID: Terri Farley, female    DOB: 01/28/1966, 49 y.o.   MRN: [id number]  Cough This is a new problem. The current episode started in the past 7 days. Associated symptoms include ear pain, nasal congestion, rhinorrhea, a sore throat and wheezing. Pertinent negatives include no chest pain, fever or shortness of breath. Treatments tried: nyquil, zyrtec, l-lysine.   patient denies high fever chills she relates head congestion drainage coughing sinus pressure and relates low bit of a tight cough slight shortness of breath that comes and goes PMH benign    Review of Systems  Constitutional: Negative for fever and activity change.  HENT: Positive for congestion, ear pain, rhinorrhea and sore throat.   Eyes: Negative for discharge.  Respiratory: Positive for cough and wheezing. Negative for shortness of breath.   Cardiovascular: Negative for chest pain.       Objective:   Physical Exam  Constitutional: She appears well-developed.  HENT:  Head: Normocephalic.  Nose: Nose normal.  Mouth/Throat: Oropharynx is clear and moist. No oropharyngeal exudate.  Neck: Neck supple.  Cardiovascular: Normal rate and normal heart sounds.   No murmur heard. Pulmonary/Chest: Effort normal. She has wheezes.  Lymphadenopathy:    She has no cervical adenopathy.  Skin: Skin is warm and dry.  Nursing note and vitals reviewed.  Slight wheeze with cough not respiratory distress       Assessment & Plan:  Viral illness Secondary sinusitis Antibodies were prescribed Warning signs were discussed I do not find any evidence of respiratory distress or significant wheezing currently I would not recommend albuterol unless having tightness in the airway if so then 2 puffs every 4 hours when necessary if worse or progressive illness call us and we will call in prednisone if problems we will recheck

## 2014-09-26 ENCOUNTER — Telehealth: Payer: Self-pay | Admitting: Family Medicine

## 2014-09-26 MED ORDER — AMOXICILLIN 500 MG PO CAPS: 500.0000 mg | ORAL_CAPSULE | Freq: Three times a day (TID) | ORAL | Status: DC

## 2014-09-26 NOTE — Telephone Encounter (Signed)
Patient seen on 09/24/2014 and given a z-pack.  She said she is having a side effect of not being able to hear well.  She is hoping we can change her antibiotic to either amoxicillan or penicillan because these work best for her.  [facility] [place]

## 2014-09-26 NOTE — Telephone Encounter (Signed)
amox 500 tid ten d 

## 2014-09-26 NOTE — Telephone Encounter (Signed)
Notified patient that medication has been sent to pharmacy. °

## 2014-12-28 IMAGING — CT CT ABD-PELV W/ CM
2 of 4 series · 16 of 46 positions shown, 18 images · IV contrast (Omnipaque 300)
Comparison: CT scan of the abdomen dated July 07, 2010

CLINICAL DATA: Five-day history of lower pelvic pain and
constipation.

EXAM:
CT ABDOMEN AND PELVIS WITH CONTRAST
TECHNIQUE: Multidetector CT imaging of the abdomen and pelvis was performed
using the standard protocol following bolus administration of
intravenous contrast.
CONTRAST:  100mL OMNIPAQUE IOHEXOL 300 MG/ML SOLN intravenously. The
patient also received oral contrast material.

[Series 2: abd_pel_with 5.0 b40f · axial · 0.89mm/px · z∈[-418,+2]mm · 13 of 94 slices shown, 15 images]
[im 5/94  soft-tissue]
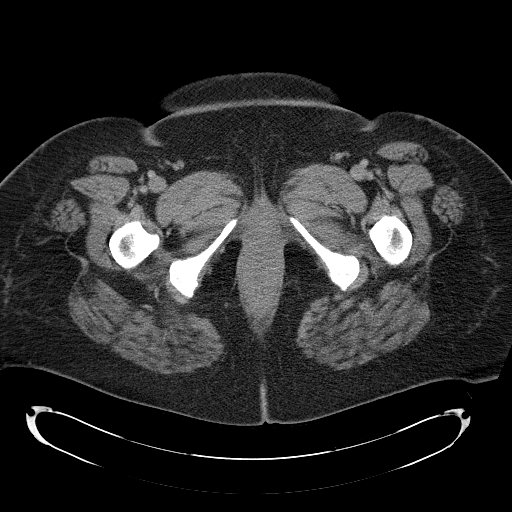
[im 5/94  bone]
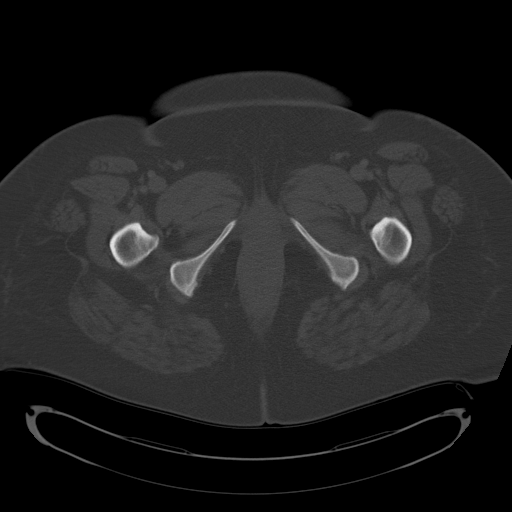
[im 14/94  soft-tissue]
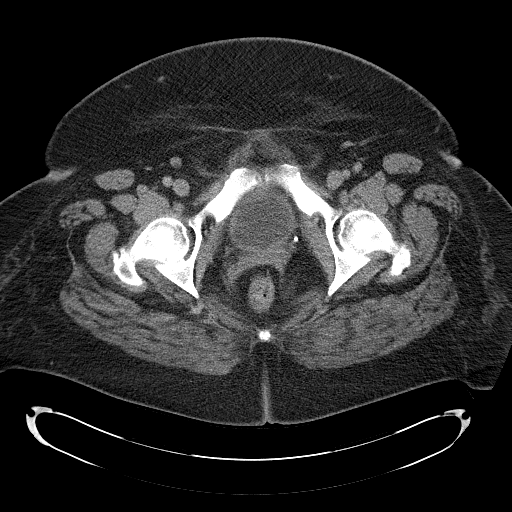
[im 19/94  soft-tissue]
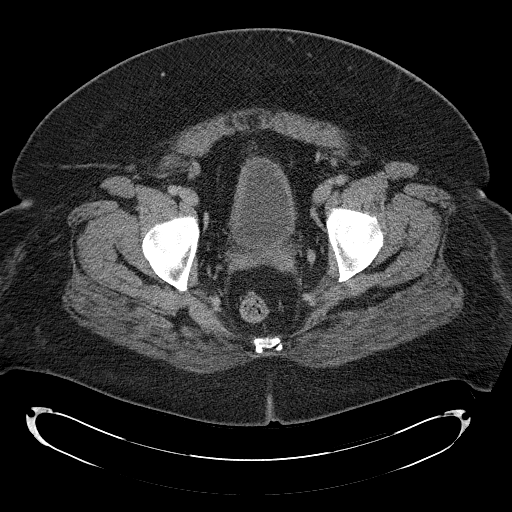
[im 28/94  soft-tissue]
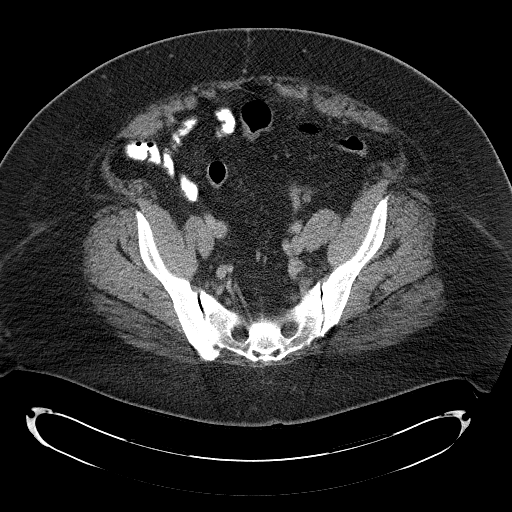
[im 33/94  soft-tissue]
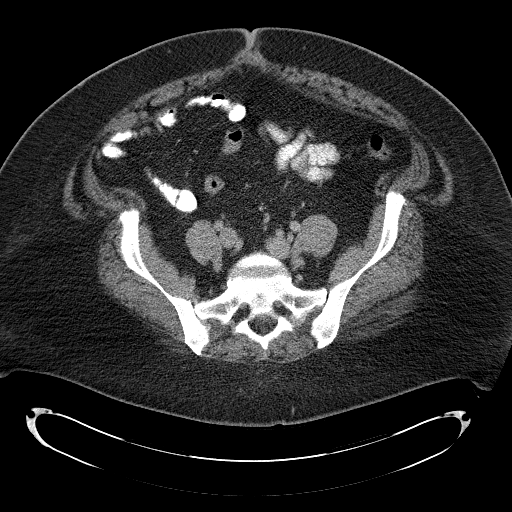
[im 42/94  soft-tissue]
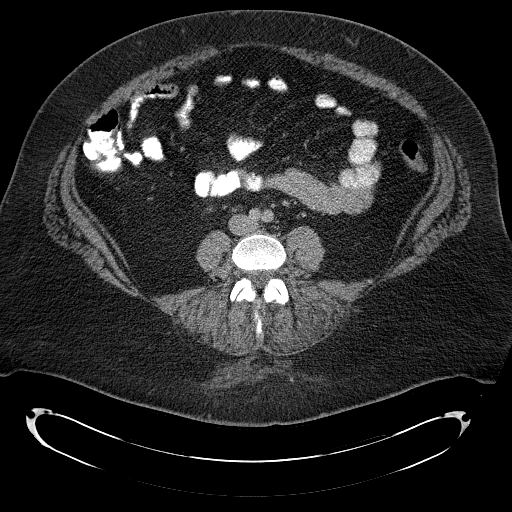
[im 47/94  soft-tissue]
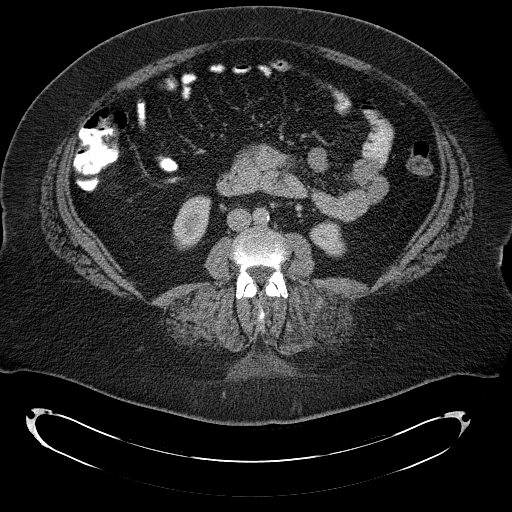
[im 52/94  soft-tissue]
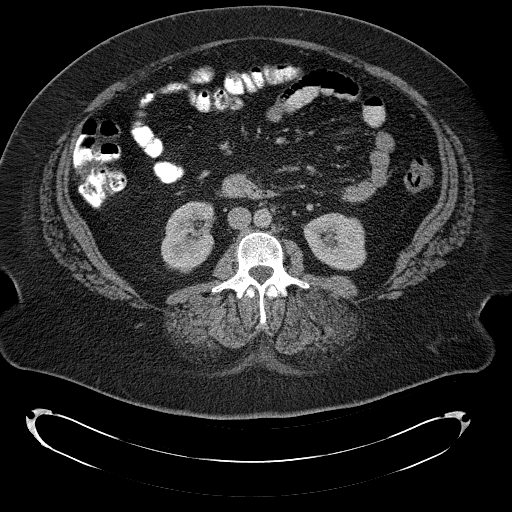
[im 61/94  soft-tissue]
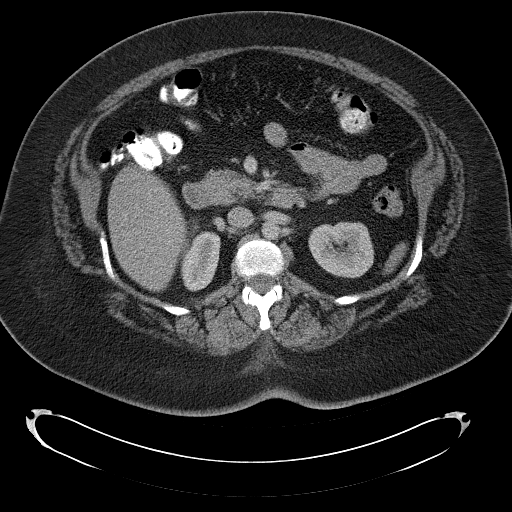
[im 61/94  bone]
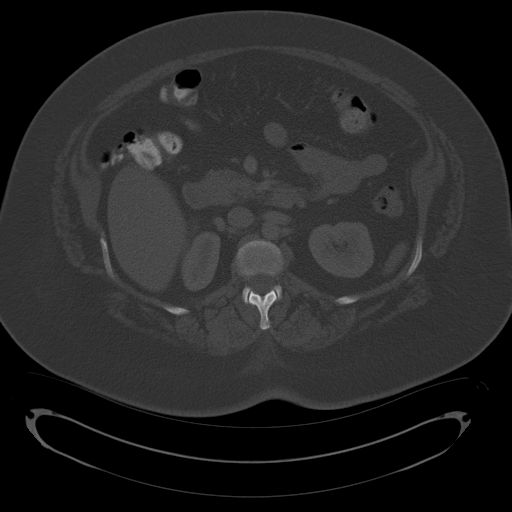
[im 66/94  soft-tissue]
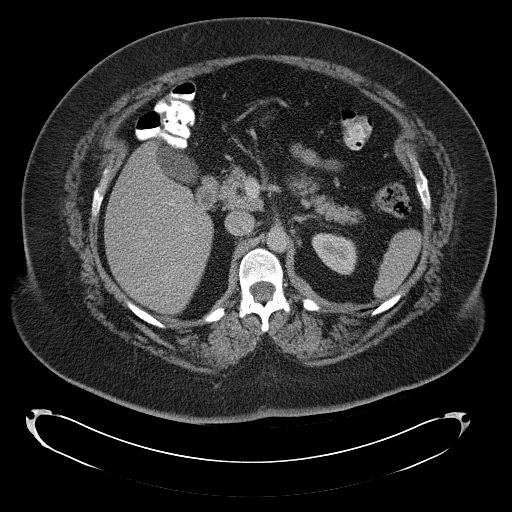
[im 75/94  soft-tissue]
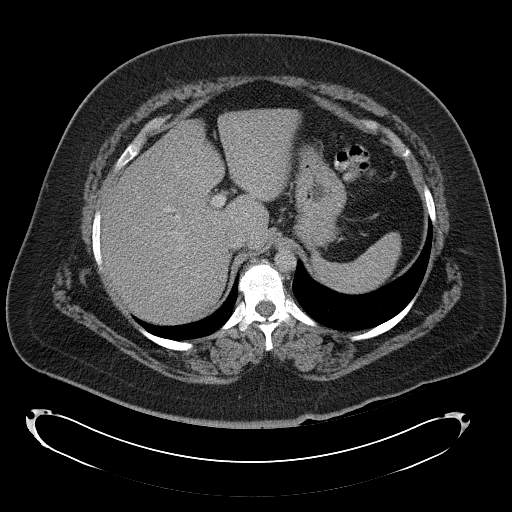
[im 80/94  soft-tissue]
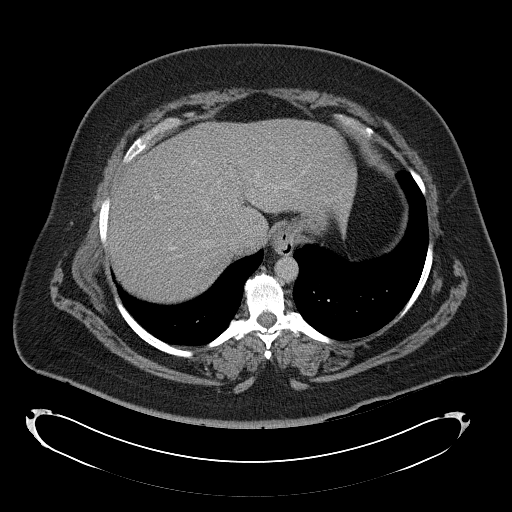
[im 89/94  soft-tissue]
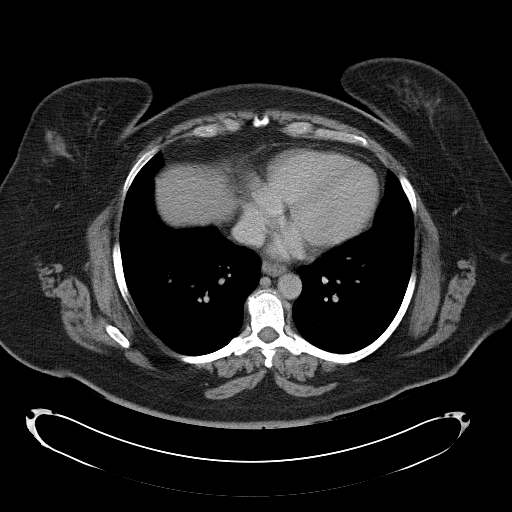

[Series 4: abd_pel_with 3.0 spo cor · coronal · 0.87mm/px · 3 of 101 slices shown]
[im 34/101  soft-tissue]
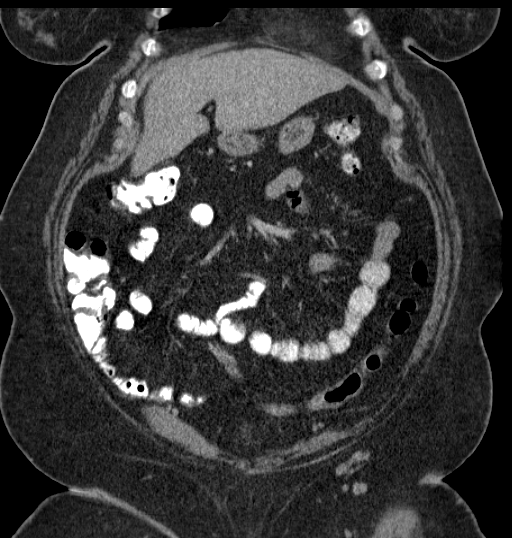
[im 45/101  soft-tissue]
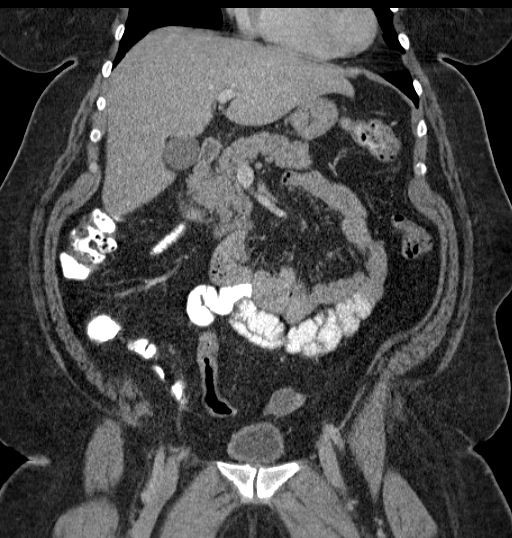
[im 56/101  soft-tissue]
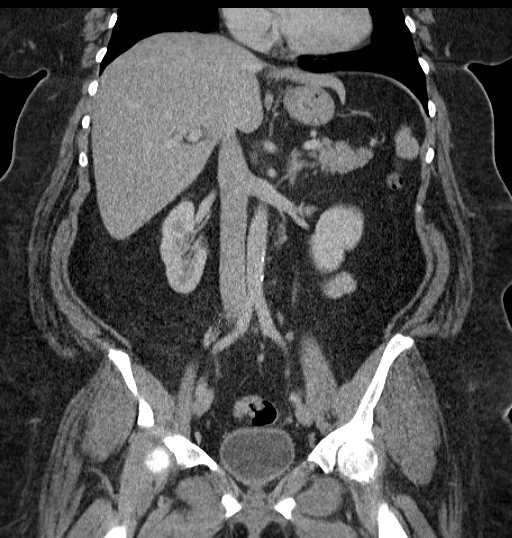

[16 of 46 positions shown; findings below may reference images not displayed]

FINDINGS: Within the pelvis the uterus is surgically absent. There is a soft
tissue density structure demonstrated on images 67-73 which may
reflect a complex cystic left adnexal process. This is smaller than
that seen before with the cystic component being smaller as well.
The overall AP dimension is 4.1 cm, the maximal transverse dimension
is 3.4 cm, and the superior- inferior dimension is 2.3 cm. The
presumed cystic component measures approximately 1.6 cm in diameter.
There is no free fluid in the pelvis. The urinary bladder and
rectosigmoid colon exhibit no acute abnormalities. There is no
significant inguinal hernia.

The liver exhibits no focal mass nor ductal dilation. I cannot
exclude a tiny gas containing gallstone on image 26. The gallbladder
is normally distended. There is a small hiatal hernia. The pancreas,
spleen, nondistended stomach, adrenal glands, and kidneys are normal
in appearance. The caliber of the abdominal aorta is normal. The
psoas musculature exhibits no acute abnormality. The partially
contrast-filled loops of small and large bowel exhibit no evidence
of ileus nor of obstruction. A normal calibered partially
contrast-filled appendix is demonstrated on images 57 through 60.

The lung bases are clear. The lumbar vertebral bodies are preserved
in height. The bony pelvis exhibits no acute abnormality.
IMPRESSION: 1. There is a persistent mixed density mass in the anterior aspect
of the pelvis to the left of midline. This has decreased in size
since the previous study. Further evaluation with pelvic ultrasound
would be useful. The uterus is surgically absent. There is no right
adnexal mass.
2. I cannot exclude a punctate gas containing gallstones but there
is no sonographic evidence of acute cholecystitis nor other acute
hepatobiliary abnormality.
3. There is no acute urinary tract abnormality demonstrated.
4. There is no evidence of a small or large bowel obstruction. There
is no evidence of enteritis or colitis or acute diverticulitis.
5. There is no intra-abdominal nor pelvic lymphadenopathy or free
fluid.
These results will be called to the ordering clinician or
representative by the Radiologist Assistant, and communication
documented in the PACS Dashboard.

## 2015-01-10 ENCOUNTER — Ambulatory Visit (INDEPENDENT_AMBULATORY_CARE_PROVIDER_SITE_OTHER): Payer: Commercial Managed Care - HMO | Admitting: Family Medicine

## 2015-01-10 ENCOUNTER — Encounter: Payer: Self-pay | Admitting: Family Medicine

## 2015-01-10 VITALS — Temp 98.2°F | Ht 65.5 in | Wt 277.2 lb

## 2015-01-10 DIAGNOSIS — J019 Acute sinusitis, unspecified: Secondary | ICD-10-CM | POA: Diagnosis not present

## 2015-01-10 DIAGNOSIS — B9689 Other specified bacterial agents as the cause of diseases classified elsewhere: Secondary | ICD-10-CM

## 2015-01-10 MED ORDER — DOXYCYCLINE HYCLATE 100 MG PO CAPS: 100.0000 mg | ORAL_CAPSULE | Freq: Two times a day (BID) | ORAL | Status: DC

## 2015-01-10 NOTE — Progress Notes (Signed)
   Subjective:    Patient ID: Terri Farley, female    DOB: 04/16/1966, 49 y.o.   MRN: [id number]  Sinus Problem This is a new problem. The current episode started in the past 7 days. Associated symptoms include congestion, coughing, headaches and sinus pressure. Pertinent negatives include no ear pain or shortness of breath. Past treatments include nothing.   Patient with approximately one-week history of head congestion drainage over the past several days sinus pressure drainage not feeling good denies high fever chills   Review of Systems  Constitutional: Negative for fever and activity change.  HENT: Positive for congestion, rhinorrhea and sinus pressure. Negative for ear pain.   Eyes: Negative for discharge.  Respiratory: Positive for cough. Negative for shortness of breath and wheezing.   Cardiovascular: Negative for chest pain.  Neurological: Positive for headaches.       Objective:   Physical Exam  Constitutional: She appears well-developed.  HENT:  Head: Normocephalic.  Nose: Nose normal.  Mouth/Throat: Oropharynx is clear and moist. No oropharyngeal exudate.  Neck: Neck supple.  Cardiovascular: Normal rate and normal heart sounds.   No murmur heard. Pulmonary/Chest: Effort normal and breath sounds normal. She has no wheezes.  Lymphadenopathy:    She has no cervical adenopathy.  Skin: Skin is warm and dry.  Nursing note and vitals reviewed.    Neck is supple she is not toxic she has moderate sinus tenderness maxillary region     Assessment & Plan:  Viral syndrome with secondary sinusitis antibiotics prescribed warning signs discussed patient should gradually get better over the next 5-7 days if progressive illness high fevers or worse may need x-rays may need lab work

## 2015-02-18 ENCOUNTER — Ambulatory Visit (INDEPENDENT_AMBULATORY_CARE_PROVIDER_SITE_OTHER): Payer: Commercial Managed Care - HMO | Admitting: Family Medicine

## 2015-02-18 ENCOUNTER — Encounter: Payer: Self-pay | Admitting: Family Medicine

## 2015-02-18 VITALS — BP 132/90 | Temp 98.8°F | Ht 65.0 in | Wt 275.0 lb

## 2015-02-18 DIAGNOSIS — J019 Acute sinusitis, unspecified: Secondary | ICD-10-CM | POA: Diagnosis not present

## 2015-02-18 DIAGNOSIS — J4520 Mild intermittent asthma, uncomplicated: Secondary | ICD-10-CM | POA: Diagnosis not present

## 2015-02-18 DIAGNOSIS — B9689 Other specified bacterial agents as the cause of diseases classified elsewhere: Secondary | ICD-10-CM

## 2015-02-18 MED ORDER — AMOXICILLIN-POT CLAVULANATE 875-125 MG PO TABS: 1.0000 | ORAL_TABLET | Freq: Two times a day (BID) | ORAL | Status: AC

## 2015-02-18 NOTE — Progress Notes (Signed)
   Subjective:    Patient ID: Terri Farley, female    DOB: 09/29/1965, 49 y.o.   MRN: [id number]  Cough This is a new problem. Episode onset: 2 days. Associated symptoms include a fever, headaches, nasal congestion, a sore throat and wheezing. Treatments tried: zyrtec, nyquil.    Had infxn six wks ago  Headache and dim enrgy  Cough productiv3e and gunksy   pps low gr fever  Sig dust exposure before this started   Review of Systems  Constitutional: Positive for fever.  HENT: Positive for sore throat.   Respiratory: Positive for cough and wheezing.   Neurological: Positive for headaches.       Objective:   Physical Exam  Alert moderate malaise. HEENT frontal maxillary tenderness pharynx erythematous tender anterior nodes otherwise normal. Lungs faint wheeze no tachypnea heart rare rhythm      Assessment & Plan:  Impression post allergenic exposure rhinosinusitis/bronchitis with reactive airways plan antibiotics prescribed. Albuterol 2 sprays 4 times a day. Symptom care discussed WSL

## 2016-07-28 ENCOUNTER — Encounter: Payer: Self-pay | Admitting: Family Medicine

## 2016-07-28 ENCOUNTER — Ambulatory Visit (INDEPENDENT_AMBULATORY_CARE_PROVIDER_SITE_OTHER): Payer: Commercial Managed Care - HMO | Admitting: Family Medicine

## 2016-07-28 VITALS — BP 138/80 | Temp 97.7°F | Ht 65.0 in | Wt 284.0 lb

## 2016-07-28 DIAGNOSIS — B9689 Other specified bacterial agents as the cause of diseases classified elsewhere: Secondary | ICD-10-CM | POA: Diagnosis not present

## 2016-07-28 DIAGNOSIS — J019 Acute sinusitis, unspecified: Secondary | ICD-10-CM

## 2016-07-28 MED ORDER — AMOXICILLIN-POT CLAVULANATE 875-125 MG PO TABS: 1.0000 | ORAL_TABLET | Freq: Two times a day (BID) | ORAL | 0 refills | Status: DC

## 2016-07-28 NOTE — Progress Notes (Signed)
   Subjective:    Patient ID: Terri Farley, female    DOB: 04/23/1966, 51 y.o.   MRN: [id number]  Cough  This is a new problem. Episode onset: one week. Associated symptoms comments: Runny nose, wheezing. Treatments tried: nyquil.   Started viral Coughing dranage and into the chest  Wheezing, worse with exertion.  Allergies that act up a coule times per yr No known sickness  Review of Systems  Respiratory: Positive for cough.        Objective:   Physical Exam Alert, mild malaise. Hydration good Vitals stable. frontal/ maxillary tenderness evident positive nasal congestion. pharynx normal neck supple  lungs clear/no crackles or wheezes. heart regular in rhythm        Assessment & Plan:  Impression rhinosinusitis likely post viral, discussed with patient. plan antibiotics prescribed. Questions answered. Symptomatic care discussed. warning signs discussed. WSL

## 2016-09-09 DIAGNOSIS — M5412 Radiculopathy, cervical region: Secondary | ICD-10-CM | POA: Diagnosis not present

## 2016-09-09 DIAGNOSIS — M545 Low back pain: Secondary | ICD-10-CM | POA: Diagnosis not present

## 2016-09-09 DIAGNOSIS — M542 Cervicalgia: Secondary | ICD-10-CM | POA: Diagnosis not present

## 2016-09-14 DIAGNOSIS — M5412 Radiculopathy, cervical region: Secondary | ICD-10-CM | POA: Diagnosis not present

## 2016-09-14 DIAGNOSIS — M542 Cervicalgia: Secondary | ICD-10-CM | POA: Diagnosis not present

## 2016-09-14 DIAGNOSIS — M545 Low back pain: Secondary | ICD-10-CM | POA: Diagnosis not present

## 2016-09-20 DIAGNOSIS — M5412 Radiculopathy, cervical region: Secondary | ICD-10-CM | POA: Diagnosis not present

## 2016-09-20 DIAGNOSIS — M542 Cervicalgia: Secondary | ICD-10-CM | POA: Diagnosis not present

## 2016-09-20 DIAGNOSIS — M545 Low back pain: Secondary | ICD-10-CM | POA: Diagnosis not present

## 2017-10-06 DIAGNOSIS — M545 Low back pain: Secondary | ICD-10-CM | POA: Diagnosis not present

## 2017-10-06 DIAGNOSIS — M5412 Radiculopathy, cervical region: Secondary | ICD-10-CM | POA: Diagnosis not present

## 2017-10-06 DIAGNOSIS — M542 Cervicalgia: Secondary | ICD-10-CM | POA: Diagnosis not present

## 2017-10-10 DIAGNOSIS — M542 Cervicalgia: Secondary | ICD-10-CM | POA: Diagnosis not present

## 2017-10-10 DIAGNOSIS — M545 Low back pain: Secondary | ICD-10-CM | POA: Diagnosis not present

## 2017-10-10 DIAGNOSIS — M5412 Radiculopathy, cervical region: Secondary | ICD-10-CM | POA: Diagnosis not present

## 2022-03-02 ENCOUNTER — Encounter: Payer: Self-pay | Admitting: Nurse Practitioner

## 2022-03-02 ENCOUNTER — Ambulatory Visit: Payer: Commercial Managed Care - HMO | Admitting: Nurse Practitioner

## 2022-03-02 VITALS — BP 136/87 | Ht 65.0 in | Wt 277.6 lb

## 2022-03-02 DIAGNOSIS — M7989 Other specified soft tissue disorders: Secondary | ICD-10-CM | POA: Diagnosis not present

## 2022-03-02 NOTE — Progress Notes (Signed)
   Subjective:    Patient ID: Terri Farley, female    DOB: [birth year], 56 y.o.   MRN: [id number]  HPI Patient arrives to discuss fluid retention. Patient is interested in trying a fluid pill to help.  Patient states that her left leg has swelled on and off for multiple years. However recently she has noticed more frequent swelling to her left upper leg. Patient states that at one point she was seen by cardiology who wanted to start her on medications however, she refused.  Patient denies SOB or orthopnea, but does admit to getting winded with movement. Patient denies any chest pain or other swelling. Patient denies pain to left leg.    Review of Systems  Cardiovascular:  Positive for leg swelling.       Objective:   Physical Exam Vitals reviewed.  Constitutional:      General: She is not in acute distress.    Appearance: Normal appearance. She is normal weight. She is not ill-appearing, toxic-appearing or diaphoretic.  HENT:     Head: Normocephalic and atraumatic.  Cardiovascular:     Rate and Rhythm: Normal rate and regular rhythm.     Pulses: Normal pulses.     Heart sounds: Normal heart sounds. No murmur heard. Pulmonary:     Effort: Pulmonary effort is normal. No respiratory distress.     Breath sounds: Normal breath sounds. No wheezing.  Musculoskeletal:     Right lower leg: No edema.     Left lower leg: Edema present.     Comments: Moderate non pitting edema to left upper leg  Skin:    General: Skin is warm.     Capillary Refill: Capillary refill takes less than 2 seconds.  Neurological:     Mental Status: She is alert.     Comments: Grossly intact  Psychiatric:        Mood and Affect: Mood normal.        Behavior: Behavior normal.           Assessment & Plan:   1. Left leg swelling - Patient declines any lab testing today. Last labs from 2015. - Expressed to patient the importance of up to date lab work to ensure a diuretic can be prescribed safely. -  Furthermore, up to date lab work needed to identify possible source of swelling (cardiac, renal, blood clot, etc). - Despite discussion, patient declined any lab testing today. Patient concerned that if we do lab work they will find more health issues. Patient states that she was advised to be on heart medication at one point and she declined to take medications.  - Patient agrees to think about her decision to decline lab work and return to clinic if she reconsiders.
